# Patient Record
Sex: Male | Born: 1995 | Race: White | Hispanic: No | Marital: Single | Smoking: Never smoker
Health system: Southern US, Community
[De-identification: ages and names within clinical notes are randomized; demographics above are authoritative.]

## PROBLEM LIST (undated history)

## (undated) HISTORY — PX: WISDOM TOOTH EXTRACTION: SHX21

---
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
## 2014-03-04 ENCOUNTER — Encounter: Payer: Self-pay | Admitting: Family Medicine

## 2014-03-04 ENCOUNTER — Ambulatory Visit (INDEPENDENT_AMBULATORY_CARE_PROVIDER_SITE_OTHER): Payer: Managed Care, Other (non HMO) | Admitting: Family Medicine

## 2014-03-04 VITALS — BP 100/68 | Temp 99.5°F | Ht 64.0 in | Wt 114.4 lb

## 2014-03-04 DIAGNOSIS — J019 Acute sinusitis, unspecified: Secondary | ICD-10-CM

## 2014-03-04 DIAGNOSIS — J309 Allergic rhinitis, unspecified: Secondary | ICD-10-CM

## 2014-03-04 MED ORDER — AZITHROMYCIN 250 MG PO TABS
ORAL_TABLET | ORAL | Status: DC
Start: 2014-03-04 — End: 2014-04-11

## 2014-03-04 NOTE — Progress Notes (Signed)
   Subjective:    Patient ID: Dale Lopez, male    DOB: 11/04/1995, 18 y.o.   MRN: [id number]  Cough This is a new problem. The current episode started in the past 7 days. The problem has been unchanged. The problem occurs constantly. The cough is non-productive. Associated symptoms include a fever, headaches and a sore throat. Nothing aggravates the symptoms. He has tried OTC cough suppressant for the symptoms. The treatment provided no relief.   Patient states he has no other concerns at this time.    Review of Systems  Constitutional: Positive for fever.  HENT: Positive for sore throat.   Respiratory: Positive for cough.   Neurological: Positive for headaches.       Objective:   Physical Exam  Nursing note and vitals reviewed. Constitutional: He appears well-developed.  HENT:  Head: Normocephalic.  Mouth/Throat: Oropharynx is clear and moist. No oropharyngeal exudate.  Neck: Normal range of motion.  Cardiovascular: Normal rate, regular rhythm and normal heart sounds.   No murmur heard. Pulmonary/Chest: Effort normal and breath sounds normal. He has no wheezes.  Lymphadenopathy:    He has no cervical adenopathy.  Neurological: He exhibits normal muscle tone.  Skin: Skin is warm and dry.          Assessment & Plan:  Viral syndrome possible sinusitis antibiotics prescribed but encouraged not to get filled for couple days if improving no need for antibiotics. Warning signs discussed.

## 2014-04-11 ENCOUNTER — Ambulatory Visit (HOSPITAL_COMMUNITY)
Admission: RE | Admit: 2014-04-11 | Discharge: 2014-04-11 | Disposition: A | Discharge status: Home / Self Care | Payer: Managed Care, Other (non HMO) | Source: Ambulatory Visit | Attending: Family Medicine | Admitting: Family Medicine

## 2014-04-11 ENCOUNTER — Encounter: Payer: Self-pay | Admitting: Family Medicine

## 2014-04-11 ENCOUNTER — Ambulatory Visit (INDEPENDENT_AMBULATORY_CARE_PROVIDER_SITE_OTHER): Payer: Managed Care, Other (non HMO) | Admitting: Family Medicine

## 2014-04-11 VITALS — BP 100/72 | Temp 98.6°F | Ht 64.0 in | Wt 114.4 lb

## 2014-04-11 DIAGNOSIS — R05 Cough: Secondary | ICD-10-CM

## 2014-04-11 DIAGNOSIS — R079 Chest pain, unspecified: Secondary | ICD-10-CM | POA: Insufficient documentation

## 2014-04-11 DIAGNOSIS — R0789 Other chest pain: Secondary | ICD-10-CM

## 2014-04-11 DIAGNOSIS — R002 Palpitations: Secondary | ICD-10-CM

## 2014-04-11 DIAGNOSIS — R059 Cough, unspecified: Secondary | ICD-10-CM

## 2014-04-11 MED ORDER — BECLOMETHASONE DIPROPIONATE 40 MCG/ACT IN AERS: 2.0000 | INHALATION_SPRAY | Freq: Two times a day (BID) | RESPIRATORY_TRACT | Status: DC

## 2014-04-11 MED ORDER — BECLOMETHASONE DIPROPIONATE 40 MCG/ACT IN AERS: 1.0000 | INHALATION_SPRAY | Freq: Two times a day (BID) | RESPIRATORY_TRACT | Status: DC

## 2014-04-11 MED ORDER — PREDNISONE 20 MG PO TABS: ORAL_TABLET | ORAL | Status: DC

## 2014-04-11 NOTE — Progress Notes (Signed)
   Subjective:    Patient ID: Dale Lopez, male    DOB: Sep 26, 1996, 18 y.o.   MRN: [id number]  Cough This is a new problem. The current episode started in the past 7 days. The problem has been gradually worsening. The cough is non-productive. Associated symptoms include chest pain. Nothing aggravates the symptoms. Treatments tried: ibuprofen. The treatment provided no relief. His past medical history is significant for asthma.   Patient states that he has no other concerns at this time.  Sore in the side Sneezing Cough for over 5 weeks No fever History of allergic rhinitis. Has not been using any allergy medicine recently. Review of Systems  Respiratory: Positive for cough.   Cardiovascular: Positive for chest pain.   no fevers. Denies wheezing but does state he coughs a lot he states when he takes a deep breath he coughs.     Objective:   Physical Exam Patient cough several times while in the office. Eardrums normal throat is normal neck is supple lungs are clear cough is noted       Assessment & Plan:  Allergic rhinitis/probable allergic reactive airway as well recommend steroid inhaler prednisone taper and allergy tablets if not improving over the course of the next few days followup should be doing much better over the next few weeks chest x-ray ordered. Once doing well for at least 4-6 weeks he should try to stop the steroid inhaler to see if this stays away. He will let us know if he has ongoing trouble. May need referral to allergist if ongoing troubles.  Patient did have a few brief palpitations earlier today his EKG looks normal he is to try to avoid excessive caffeine he will notify us if any ongoing troubles

## 2014-04-16 NOTE — Progress Notes (Signed)
Patient notified and verbalized understanding. 

## 2014-04-21 ENCOUNTER — Encounter: Payer: Self-pay | Admitting: Family Medicine

## 2014-04-23 ENCOUNTER — Encounter: Payer: Self-pay | Admitting: Nurse Practitioner

## 2014-04-23 ENCOUNTER — Ambulatory Visit (INDEPENDENT_AMBULATORY_CARE_PROVIDER_SITE_OTHER): Payer: Managed Care, Other (non HMO) | Admitting: Nurse Practitioner

## 2014-04-23 VITALS — BP 102/64 | Temp 98.6°F | Ht 64.0 in | Wt 116.0 lb

## 2014-04-23 DIAGNOSIS — L738 Other specified follicular disorders: Secondary | ICD-10-CM

## 2014-04-23 DIAGNOSIS — L739 Follicular disorder, unspecified: Secondary | ICD-10-CM

## 2014-04-23 MED ORDER — DOXYCYCLINE HYCLATE 100 MG PO TABS: 100.0000 mg | ORAL_TABLET | Freq: Two times a day (BID) | ORAL | Status: DC

## 2014-04-27 ENCOUNTER — Encounter: Payer: Self-pay | Admitting: Nurse Practitioner

## 2014-04-27 NOTE — Progress Notes (Signed)
Subjective:  Presents complaints of a rash that began a few days ago. Nonpruritic. Minimally tender. Has faded some. No fever. No headache. Mild cough. No change in her hygiene products. No known contacts. Patient just completed a course of prednisone started 6/19. Has a history of acne but states this is suddenly worse.  Objective:   BP 102/64  Temp(Src) 98.6 F (37 C) (Oral)  Ht 5\' 4"  (1.626 m)  Wt 116 lb (52.617 kg)  BMI 19.90 kg/m2 NAD. Alert, oriented. TMs normal limit. Pharynx clear. Neck supple with minimal adenopathy. Lungs clear. Heart regular rhythm. Multiple moderately erythematous papules and pustules noted over the arms and trunk area with a few lesions on the face.  Assessment: Folliculitis  most likely steroid induced related to recent prednisone taper  Plan:  Meds ordered this encounter  Medications  . doxycycline (VIBRA-TABS) 100 MG tablet    Sig: Take 1 tablet (100 mg total) by mouth 2 (two) times daily.    Dispense:  20 tablet    Refill:  0    Order Specific Question:  Supervising Provider    Answer:  Riccardo DubinLUKING, WILLIAM S [[id number]]   Explained it is unlikely related to use of Qvar. Continue Qvar as directed. Call back next week if no improvement, sooner if worse.

## 2014-04-30 ENCOUNTER — Encounter: Payer: Self-pay | Admitting: Family Medicine

## 2014-04-30 ENCOUNTER — Ambulatory Visit (INDEPENDENT_AMBULATORY_CARE_PROVIDER_SITE_OTHER): Payer: Managed Care, Other (non HMO) | Admitting: Family Medicine

## 2014-04-30 VITALS — BP 112/76 | Temp 98.5°F | Ht 64.0 in | Wt 115.5 lb

## 2014-04-30 DIAGNOSIS — R748 Abnormal levels of other serum enzymes: Secondary | ICD-10-CM

## 2014-04-30 DIAGNOSIS — R109 Unspecified abdominal pain: Secondary | ICD-10-CM

## 2014-04-30 LAB — POCT URINALYSIS DIPSTICK
Spec Grav, UA: 1.01
pH, UA: 6

## 2014-04-30 LAB — CBC WITH DIFFERENTIAL/PLATELET
BASOS ABS: 0.1 10*3/uL (ref 0.0–0.1)
Basophils Relative: 1 % (ref 0–1)
EOS ABS: 0.2 10*3/uL (ref 0.0–0.7)
Eosinophils Relative: 3 % (ref 0–5)
HCT: 43.8 % (ref 39.0–52.0)
Hemoglobin: 15.7 g/dL (ref 13.0–17.0)
LYMPHS ABS: 1.9 10*3/uL (ref 0.7–4.0)
Lymphocytes Relative: 32 % (ref 12–46)
MCH: 28.7 pg (ref 26.0–34.0)
MCHC: 35.8 g/dL (ref 30.0–36.0)
MCV: 80.1 fL (ref 78.0–100.0)
Monocytes Absolute: 0.7 10*3/uL (ref 0.1–1.0)
Monocytes Relative: 11 % (ref 3–12)
Neutro Abs: 3.2 10*3/uL (ref 1.7–7.7)
Neutrophils Relative %: 53 % (ref 43–77)
Platelets: 240 10*3/uL (ref 150–400)
RBC: 5.47 MIL/uL (ref 4.22–5.81)
RDW: 14.1 % (ref 11.5–15.5)
WBC: 6 10*3/uL (ref 4.0–10.5)

## 2014-04-30 LAB — BASIC METABOLIC PANEL
BUN: 10 mg/dL (ref 6–23)
CHLORIDE: 105 meq/L (ref 96–112)
CO2: 27 meq/L (ref 19–32)
CREATININE: 0.99 mg/dL (ref 0.50–1.35)
Calcium: 10 mg/dL (ref 8.4–10.5)
Glucose, Bld: 71 mg/dL (ref 70–99)
POTASSIUM: 4.7 meq/L (ref 3.5–5.3)
Sodium: 140 mEq/L (ref 135–145)

## 2014-04-30 LAB — HEPATIC FUNCTION PANEL
ALBUMIN: 4.7 g/dL (ref 3.5–5.2)
ALK PHOS: 69 U/L (ref 39–117)
ALT: 59 U/L — ABNORMAL HIGH (ref 0–53)
AST: 30 U/L (ref 0–37)
BILIRUBIN TOTAL: 0.6 mg/dL (ref 0.2–1.1)
Bilirubin, Direct: 0.2 mg/dL (ref 0.0–0.3)
Indirect Bilirubin: 0.4 mg/dL (ref 0.2–1.1)
Total Protein: 7.3 g/dL (ref 6.0–8.3)

## 2014-04-30 LAB — LIPASE: Lipase: 25 U/L (ref 0–75)

## 2014-04-30 MED ORDER — PANTOPRAZOLE SODIUM 40 MG PO TBEC: 40.0000 mg | DELAYED_RELEASE_TABLET | Freq: Every day | ORAL | Status: DC

## 2014-04-30 NOTE — Progress Notes (Signed)
   Subjective:    Patient ID: Dale Lopez, male    DOB: [birth year], 18 y.o.   MRN: [id number]  Abdominal Pain This is a recurrent problem. The current episode started 1 to 4 weeks ago. The onset quality is gradual. The problem occurs intermittently. The problem has been gradually worsening. The pain is located in the RLQ, RUQ and LLQ. The pain is at a severity of 5/10. The pain is moderate. The quality of the pain is burning and cramping. The abdominal pain radiates to the back. Nothing aggravates the pain. The pain is relieved by nothing. Treatments tried: stool softeners. The treatment provided no relief.   Patient states he has no other concerns at this time.    Review of Systems  Gastrointestinal: Positive for abdominal pain.   denies vomiting diarrhea denies bloody stools. Denies right upper quadrant pain.     Objective:   Physical Exam Lungs clear heart regular neck neck no masses eardrums normal abdomen minimal tenderness       Assessment & Plan:  Abdominal pain PPI prescribed avoid NSAIDs labs ordered await results warning signs discussed

## 2014-05-09 NOTE — Addendum Note (Signed)
Addended byOneal Deputy: Roshawna Colclasure D on: 05/09/2014 03:34 PM   Modules accepted: Orders

## 2014-05-12 NOTE — Progress Notes (Signed)
Patient notified and verbalized understanding(US scheduled for 7/23 at 12:00 per pt request)

## 2014-05-15 ENCOUNTER — Ambulatory Visit (HOSPITAL_COMMUNITY)
Admission: RE | Admit: 2014-05-15 | Discharge: 2014-05-15 | Disposition: A | Discharge status: Home / Self Care | Payer: Managed Care, Other (non HMO) | Source: Ambulatory Visit | Attending: Family Medicine | Admitting: Family Medicine

## 2014-05-15 ENCOUNTER — Ambulatory Visit (INDEPENDENT_AMBULATORY_CARE_PROVIDER_SITE_OTHER): Payer: Managed Care, Other (non HMO) | Admitting: Family Medicine

## 2014-05-15 ENCOUNTER — Encounter: Payer: Self-pay | Admitting: Family Medicine

## 2014-05-15 VITALS — BP 118/82 | Ht 64.0 in | Wt 115.4 lb

## 2014-05-15 DIAGNOSIS — R748 Abnormal levels of other serum enzymes: Secondary | ICD-10-CM | POA: Insufficient documentation

## 2014-05-15 DIAGNOSIS — R109 Unspecified abdominal pain: Secondary | ICD-10-CM

## 2014-05-15 DIAGNOSIS — K2990 Gastroduodenitis, unspecified, without bleeding: Principal | ICD-10-CM

## 2014-05-15 DIAGNOSIS — K2970 Gastritis, unspecified, without bleeding: Secondary | ICD-10-CM

## 2014-05-15 NOTE — Progress Notes (Signed)
   Subjective:    Patient ID: Dale Lopez, male    DOB: August 27, 1996, 18 y.o.   MRN: [id number]  HPI  Patient arrives for a follow up on ad pain. Patient states he is feeling much better. He states he is trying eat healthy and strong stay physically active. He denies rectal bleeding Review of Systems  Constitutional: Negative for activity change, appetite change and fatigue.  HENT: Negative for congestion.   Respiratory: Negative for cough.   Cardiovascular: Negative for chest pain.  Gastrointestinal: Negative for abdominal pain.  Endocrine: Negative for polydipsia and polyphagia.  Neurological: Negative for weakness.  Psychiatric/Behavioral: Negative for confusion.       Objective:   Physical Exam  Vitals reviewed. Constitutional: He appears well-nourished. No distress.  Cardiovascular: Normal rate, regular rhythm and normal heart sounds.   No murmur heard. Pulmonary/Chest: Effort normal and breath sounds normal. No respiratory distress.  Musculoskeletal: He exhibits no edema.  Lymphadenopathy:    He has no cervical adenopathy.  Neurological: He is alert.  Psychiatric: His behavior is normal.          Assessment & Plan:  Abdominal pain resolved gastritis use medication for 3 months after that no need for further medication hopefully. He does not drink he is going to stay away from anti-inflammatories

## 2014-05-15 NOTE — Patient Instructions (Signed)
Use medicine for 3 months Follow up if problems

## 2014-05-29 ENCOUNTER — Ambulatory Visit: Payer: Managed Care, Other (non HMO) | Admitting: Family Medicine

## 2014-05-30 ENCOUNTER — Ambulatory Visit (INDEPENDENT_AMBULATORY_CARE_PROVIDER_SITE_OTHER): Payer: Managed Care, Other (non HMO) | Admitting: Family Medicine

## 2014-05-30 ENCOUNTER — Encounter: Payer: Self-pay | Admitting: Family Medicine

## 2014-05-30 VITALS — BP 110/70 | Temp 98.7°F | Ht 64.0 in | Wt 115.5 lb

## 2014-05-30 DIAGNOSIS — R7401 Elevation of levels of liver transaminase levels: Secondary | ICD-10-CM

## 2014-05-30 DIAGNOSIS — R1013 Epigastric pain: Secondary | ICD-10-CM

## 2014-05-30 DIAGNOSIS — G8929 Other chronic pain: Secondary | ICD-10-CM

## 2014-05-30 DIAGNOSIS — R7402 Elevation of levels of lactic acid dehydrogenase (LDH): Secondary | ICD-10-CM

## 2014-05-30 DIAGNOSIS — K640 First degree hemorrhoids: Secondary | ICD-10-CM

## 2014-05-30 DIAGNOSIS — R740 Nonspecific elevation of levels of transaminase and lactic acid dehydrogenase [LDH]: Secondary | ICD-10-CM

## 2014-05-30 DIAGNOSIS — K649 Unspecified hemorrhoids: Secondary | ICD-10-CM

## 2014-05-30 NOTE — Patient Instructions (Signed)

## 2014-05-30 NOTE — Progress Notes (Signed)
   Subjective:    Patient ID: Dale BallsNicholas Lopez, male    DOB: June 20, 1996, 18 y.o.   MRN: [id number]  HPI Patient is here today because he has a possible hemorrhoid. It has been present for about 4 days now. Patient states that it is painful to touch. Patient tried hemorrhoid cream and sitz bath. Patient states he is not sure if it helped or not.   Patient states he has no other concerns at this time.    Time was spent reviewing his lab work his abdominal pain from previous result Review of Systems Patient does have intermittent constipation I went over dietary measures that should be of help this.    Objective:   Physical Exam  Patient with mild hemorrhoids. Abdomen is soft. I went over his lab work including his elevated liver he agrees to repeat the liver enzymes again in a few weeks time if it is still elevated then we will do ultrasound if it is normal no ultrasound this no masses felt in his abdomen liver is not enlarged      Assessment & Plan:  Slight elevation liver enzymes recheck this again in a few weeks if not normal then will do ultrasound  Hemorrhoid-this is a small hemorrhoid measures to help him go down were discussed

## 2016-10-10 ENCOUNTER — Ambulatory Visit (INDEPENDENT_AMBULATORY_CARE_PROVIDER_SITE_OTHER): Payer: Managed Care, Other (non HMO) | Admitting: Family Medicine

## 2016-10-10 VITALS — BP 110/72 | Temp 98.3°F | Ht 64.0 in | Wt 116.0 lb

## 2016-10-10 DIAGNOSIS — R350 Frequency of micturition: Secondary | ICD-10-CM

## 2016-10-10 DIAGNOSIS — M545 Low back pain, unspecified: Secondary | ICD-10-CM

## 2016-10-10 LAB — POCT URINALYSIS DIPSTICK
Bilirubin, UA: NEGATIVE
GLUCOSE UA: NEGATIVE
Ketones, UA: NEGATIVE
NITRITE UA: POSITIVE
PROTEIN UA: NEGATIVE
SPEC GRAV UA: 1.01
UROBILINOGEN UA: 0.2
pH, UA: 6

## 2016-10-10 NOTE — Progress Notes (Signed)
   Subjective:    Patient ID: Dale Lopez, male    DOB: 12-Sep-1996, 20 y.o.   MRN: [id number]  Back Pain  This is a new problem. The current episode started 1 to 4 weeks ago. The problem occurs constantly. The problem is unchanged. The pain is present in the lumbar spine. The quality of the pain is described as aching. The pain does not radiate. The pain is at a severity of 4/10. The pain is mild. The pain is the same all the time. Stiffness is present all day. Pertinent negatives include no abdominal pain, bladder incontinence, bowel incontinence, chest pain or numbness. He has tried nothing for the symptoms.    Patient in office today c/o lumbago x 1 month.    Patient states he noticed "flakes" in urine about 1 week ago. He denies dysuria, nausea/vomiting, and fever.  Review of Systems  Cardiovascular: Negative for chest pain.  Gastrointestinal: Negative for abdominal pain and bowel incontinence.  Genitourinary: Negative for bladder incontinence.  Musculoskeletal: Positive for back pain.  Neurological: Negative for numbness.       Objective:   Physical Exam Lungs are clear hearts regular low back subjective discomfort tight hamstrings Abdomen soft UA negative      Assessment & Plan:  Urine culture sent Patient will return with a new urine specimen he relates that there is a lot of specs in a previous urine I do not see any in this urine he will give us one more specimen Back lumbar go recommend stretching exercises follow-up of ongoing

## 2016-10-11 ENCOUNTER — Telehealth: Payer: Self-pay | Admitting: *Deleted

## 2016-10-11 ENCOUNTER — Other Ambulatory Visit: Payer: Self-pay | Admitting: *Deleted

## 2016-10-11 DIAGNOSIS — R350 Frequency of micturition: Secondary | ICD-10-CM

## 2016-10-11 NOTE — Telephone Encounter (Signed)
Pt dropped off urine today. Was seen yesterday. Urine dipped and ready to look at under microscope.

## 2016-10-11 NOTE — Telephone Encounter (Signed)
Error. Urine not dipped. He had urine dipstick yesterday and urine culture. Do you want to repeat. Note states he dropped off because he saw specks in his urine.

## 2016-10-12 NOTE — Telephone Encounter (Signed)
Advised patient Dr Dale Lopez looked at his urine and does not find any evidence of a problem. If the patient is having ongoing urinary issues over the course of the next couple  week's the  next step would be for him to be referred to urology. Currently Dr Dale Lopez does not feel referral is necessary. Patient verbalized understanding.

## 2016-10-12 NOTE — Telephone Encounter (Signed)
I looked at his urine I do not find any evidence of a problem. If the patient is having ongoing urinary issues over the course of the next couple  week's the  next step would be for him to be referred to urology. currently I  do not feel referral is necessary

## 2016-10-14 LAB — URINE CULTURE

## 2017-05-25 ENCOUNTER — Encounter: Payer: Self-pay | Admitting: Nurse Practitioner

## 2017-05-25 ENCOUNTER — Ambulatory Visit (INDEPENDENT_AMBULATORY_CARE_PROVIDER_SITE_OTHER): Payer: Managed Care, Other (non HMO) | Admitting: Nurse Practitioner

## 2017-05-25 VITALS — BP 120/88 | Temp 98.4°F | Ht 64.0 in | Wt 115.0 lb

## 2017-05-25 DIAGNOSIS — B9689 Other specified bacterial agents as the cause of diseases classified elsewhere: Secondary | ICD-10-CM

## 2017-05-25 DIAGNOSIS — J069 Acute upper respiratory infection, unspecified: Secondary | ICD-10-CM | POA: Diagnosis not present

## 2017-05-25 MED ORDER — AZITHROMYCIN 250 MG PO TABS: ORAL_TABLET | ORAL | 0 refills | Status: DC

## 2017-05-25 NOTE — Patient Instructions (Addendum)
Antihistamine Nasacort AQ nasal spray  Hydrogen peroxide mixed 1:1 with warm water

## 2017-05-25 NOTE — Progress Notes (Signed)
Subjective:  Presents for complaints of sinus symptoms that began 3 days ago. No significant fever. No sore throat or sinus headache. Has had some sneezing and coughing for weeks, nonproductive. No change. No wheezing. Occasional itching and pressure mainly in the left ear with occasional ringing in the ear. No acid reflux heartburn or abdominal pain. Nonsmoker.  Objective:   BP 120/88   Temp 98.4 F (36.9 C) (Oral)   Ht 5\' 4"  (1.626 m)   Wt 115 lb (52.2 kg)   BMI 19.74 kg/m  NAD. Alert, oriented. TMs clear effusion, no erythema but few partially obscured with cerumen bilaterally. Pharynx posterior mildly erythematous with PND noted. Neck supple with mild soft anterior adenopathy. Lungs clear. No wheezing or tachypnea. Heart regular rate rhythm. Abdomen soft nontender.  Assessment:  Bacterial upper respiratory infection    Plan:   Meds ordered this encounter  Medications  . azithromycin (ZITHROMAX Z-PAK) 250 MG tablet    Sig: Take 2 tablets (500 mg) on  Day 1,  followed by 1 tablet (250 mg) once daily on Days 2 through 5.    Dispense:  6 each    Refill:  0    Order Specific Question:   Supervising Provider    Answer:   Merlyn AlbertLUKING, WILLIAM S [[id number]]   OTC meds as directed for sinus congestion. Call back if symptoms worsen or persist.

## 2017-05-30 ENCOUNTER — Ambulatory Visit (INDEPENDENT_AMBULATORY_CARE_PROVIDER_SITE_OTHER): Payer: Managed Care, Other (non HMO) | Admitting: Family Medicine

## 2017-05-30 ENCOUNTER — Encounter: Payer: Self-pay | Admitting: Family Medicine

## 2017-05-30 VITALS — BP 104/84 | Temp 98.4°F | Ht 64.0 in | Wt 114.0 lb

## 2017-05-30 DIAGNOSIS — H6123 Impacted cerumen, bilateral: Secondary | ICD-10-CM | POA: Diagnosis not present

## 2017-05-30 DIAGNOSIS — H6122 Impacted cerumen, left ear: Secondary | ICD-10-CM

## 2017-05-30 NOTE — Progress Notes (Signed)
   Subjective:    Patient ID: Dale Lopez, male    DOB: 11-Mar-1996, 21 y.o.   MRN: [id number]  Otalgia    Patient here today for ear pain and states the left ear is blocked and has decreased hearing from that ear. He states he has some ringing in the ears also, but does not know from which ear it originates.Started two weeks ago. Used peroxide, did not help. No other concerns.  Two weeks ago had ear discmfort   [phone] Then developed ringing  Tried peroxid  Pt has blocked sensation with ringing  Gets headaches  Now on antihist for sinus allergies   Non smoker   Review of Systems  HENT: Positive for ear pain.        Objective:   Physical Exam Alert vitals stable, NAD. Blood pressure good on repeat. HEENT Bilateral wax impaction otherwise normal. Lungs clear. Heart regular rate and rhythm.        Assessment & Plan:  Impression bilateral wax impaction. Long discussion held. Patient is Dale Lopez tried peroxide and Debrox in water irrigation. Experiencing tinnitus with bilateral wax obstruction. Also nonspecific mild headache. Options discussed. Patient chooses ENT referral

## 2017-06-05 ENCOUNTER — Encounter: Payer: Self-pay | Admitting: Family Medicine

## 2017-06-09 ENCOUNTER — Ambulatory Visit (INDEPENDENT_AMBULATORY_CARE_PROVIDER_SITE_OTHER): Payer: Managed Care, Other (non HMO) | Admitting: Nurse Practitioner

## 2017-06-09 VITALS — Temp 98.3°F | Ht 64.0 in | Wt 114.4 lb

## 2017-06-09 DIAGNOSIS — J329 Chronic sinusitis, unspecified: Secondary | ICD-10-CM

## 2017-06-09 MED ORDER — AZITHROMYCIN 250 MG PO TABS: ORAL_TABLET | ORAL | 0 refills | Status: DC

## 2017-06-12 ENCOUNTER — Encounter: Payer: Self-pay | Admitting: Nurse Practitioner

## 2017-06-12 NOTE — Progress Notes (Signed)
Subjective:  Presents for c/o mild frontal area headache, ear pressure and popping that began around 8/13. No fever, sore throat or runny nose. Occasional mild cough. No wheezing.   Objective:   Temp 98.3 F (36.8 C) (Oral)   Ht 5\' 4"  (1.626 m)   Wt 114 lb 6.4 oz (51.9 kg)   BMI 19.64 kg/m  NAD. Alert, oriented. TMs clear effusion. Pharynx injected with green PND noted. Neck supple with mild anterior adenopathy. Lungs clear. Heart RRR.   Assessment:  Rhinosinusitis    Plan:   Meds ordered this encounter  Medications  . azithromycin (ZITHROMAX Z-PAK) 250 MG tablet    Sig: Take 2 tablets (500 mg) on  Day 1,  followed by 1 tablet (250 mg) once daily on Days 2 through 5.    Dispense:  6 each    Refill:  0    Order Specific Question:   Supervising Provider    Answer:   Merlyn Albert [[id number]]   OTC meds as directed. Call back if worsens or persists.

## 2017-06-28 ENCOUNTER — Encounter: Payer: Self-pay | Admitting: Family Medicine

## 2017-09-29 ENCOUNTER — Ambulatory Visit (INDEPENDENT_AMBULATORY_CARE_PROVIDER_SITE_OTHER): Payer: Managed Care, Other (non HMO) | Admitting: Nurse Practitioner

## 2017-09-29 ENCOUNTER — Encounter: Payer: Self-pay | Admitting: Nurse Practitioner

## 2017-09-29 VITALS — Temp 98.9°F | Ht 64.0 in | Wt 119.8 lb

## 2017-09-29 DIAGNOSIS — B9689 Other specified bacterial agents as the cause of diseases classified elsewhere: Secondary | ICD-10-CM | POA: Diagnosis not present

## 2017-09-29 DIAGNOSIS — J069 Acute upper respiratory infection, unspecified: Secondary | ICD-10-CM | POA: Diagnosis not present

## 2017-09-29 MED ORDER — PREDNISONE 20 MG PO TABS: ORAL_TABLET | ORAL | 0 refills | Status: DC

## 2017-09-29 MED ORDER — AMOXICILLIN-POT CLAVULANATE 875-125 MG PO TABS: 1.0000 | ORAL_TABLET | Freq: Two times a day (BID) | ORAL | 0 refills | Status: DC

## 2017-10-01 ENCOUNTER — Encounter: Payer: Self-pay | Admitting: Nurse Practitioner

## 2017-10-01 NOTE — Progress Notes (Signed)
Subjective: Presents for complaints of cough and congestion for the past week.  Worse in the mornings.  Scratchy throat and left ear pressure.  Slight chest tightness with deep breath only in the mornings.  Has used an albuterol inhaler in the morning for the past 3 days.  No fever or headache.  Non-smoker.  Objective:   Temp 98.9 F (37.2 C) (Oral)   Ht 5\' 4"  (1.626 m)   Wt 119 lb 12.8 oz (54.3 kg)   BMI 20.56 kg/m  NAD.  Alert, oriented.  TMs clear effusion bilateral, no erythema.  Pharynx injected with green PND noted.  Neck supple with mild soft anterior adenopathy.  Lungs clear.  Heart regular rate and rhythm.  Assessment:  Bacterial upper respiratory infection    Plan:   Meds ordered this encounter  Medications  . amoxicillin-clavulanate (AUGMENTIN) 875-125 MG tablet    Sig: Take 1 tablet by mouth 2 (two) times daily.    Dispense:  20 tablet    Refill:  0    Order Specific Question:   Supervising Provider    Answer:   Merlyn AlbertLUKING, WILLIAM S [[id number]]  . predniSONE (DELTASONE) 20 MG tablet    Sig: 3 po qd x 3 d then 2 po qd x 3 d then 1 po qd x 2 d    Dispense:  17 tablet    Refill:  0    Order Specific Question:   Supervising Provider    Answer:   Merlyn AlbertLUKING, WILLIAM S [[id number]]   OTC meds as directed for congestion and cough.  Also given prescription for prednisone to have over the weekend with inclement weather in case tightness/reactive airways worsen.  Warning signs reviewed.  Recheck if symptoms worsen or persist.

## 2017-10-26 ENCOUNTER — Ambulatory Visit (INDEPENDENT_AMBULATORY_CARE_PROVIDER_SITE_OTHER): Payer: 59 | Admitting: Family Medicine

## 2017-10-26 ENCOUNTER — Encounter: Payer: Self-pay | Admitting: Family Medicine

## 2017-10-26 VITALS — BP 118/72 | Temp 98.8°F | Ht 64.0 in | Wt 119.0 lb

## 2017-10-26 DIAGNOSIS — J329 Chronic sinusitis, unspecified: Secondary | ICD-10-CM

## 2017-10-26 MED ORDER — BENZONATATE 100 MG PO CAPS: 100.0000 mg | ORAL_CAPSULE | Freq: Two times a day (BID) | ORAL | 0 refills | Status: DC | PRN

## 2017-10-26 MED ORDER — CLARITHROMYCIN 500 MG PO TABS: 500.0000 mg | ORAL_TABLET | Freq: Two times a day (BID) | ORAL | 0 refills | Status: DC

## 2017-10-26 NOTE — Progress Notes (Signed)
   Subjective:    Patient ID: Dale Lopez, male    DOB: 10/05/1996, 22 y.o.   MRN: [id number]  Sinusitis  This is a recurrent problem. Episode onset: one month  Associated symptoms include coughing, ear pain and headaches. Treatments tried: augmentin, prednisone. The treatment provided no relief.    Sick was improved but not healed  Does not smoke   no hx of inhaler                     Feeling tight a tties   Took all the ug  Along with the pred  Review of Systems  HENT: Positive for ear pain.   Respiratory: Positive for cough.   Neurological: Positive for headaches.       Objective:   Physical Exam   Alert, mild malaise. Hydration good Vitals stable. frontal/ maxillary tenderness evident positive nasal congestion. pharynx normal neck supple  lungs clear/no crackles or wheezes. heart regular in rhythm      Assessment & Plan:  Impression persistent rhinosinusitis likely post viral, discussed with patient. plan antibiotics prescribed. Questions answered. Symptomatic care discussed. warning signs discussed. WSL

## 2017-10-29 ENCOUNTER — Other Ambulatory Visit: Payer: Self-pay

## 2017-10-29 ENCOUNTER — Encounter (HOSPITAL_COMMUNITY): Payer: Self-pay | Admitting: Emergency Medicine

## 2017-10-29 ENCOUNTER — Emergency Department (HOSPITAL_COMMUNITY)
Admission: EM | Admit: 2017-10-29 | Discharge: 2017-10-29 | Disposition: A | Discharge status: Home / Self Care | Payer: 59 | Attending: Emergency Medicine | Admitting: Emergency Medicine

## 2017-10-29 DIAGNOSIS — H9313 Tinnitus, bilateral: Secondary | ICD-10-CM

## 2017-10-29 DIAGNOSIS — H9203 Otalgia, bilateral: Secondary | ICD-10-CM | POA: Diagnosis present

## 2017-10-29 MED ORDER — BUTALBITAL-APAP-CAFFEINE 50-325-40 MG PO TABS: 1.0000 | ORAL_TABLET | Freq: Four times a day (QID) | ORAL | 0 refills | Status: DC | PRN

## 2017-10-29 MED ORDER — FLUTICASONE PROPIONATE 50 MCG/ACT NA SUSP: 2.0000 | Freq: Every day | NASAL | 0 refills | Status: DC

## 2017-10-29 MED ORDER — ALPRAZOLAM 0.5 MG PO TABS: 0.5000 mg | ORAL_TABLET | Freq: Three times a day (TID) | ORAL | 0 refills | Status: AC | PRN

## 2017-10-29 NOTE — Discharge Instructions (Signed)
It is unclear what is causing the ringing in your ears and the rest of your symptoms. You need to be reevaluated by ENT again for further  evaluation and recommendations.   At this time, we will treat some of her symptoms and hopes to provide you some relief. There are not many medications that have been proven to fix ringing in the ear, but we will try alprazolam that can been shown to provide some relief. Use Flonase for associated nasal congestion, nasal congestion can worsen sinus pressure and worsen ringing in the ears. Continue taking her antihistamine. Take fioricet for headache. Take alprazolam for ringing in your ears and at that time.

## 2017-10-29 NOTE — ED Triage Notes (Signed)
PT c/o recurrent hx of ear infection and ringing in his ears worsening over the past 5 months. PT stated he has followed up with ENT recently and then went back to PCP (Dr. Gerda DissLuking) on 10/26/17 and was started on Clarithromycin and benzonatate that day.

## 2017-10-29 NOTE — ED Provider Notes (Signed)
[facility] EMERGENCY DEPARTMENT Provider Note   CSN: [id number] Arrival date & time: 10/29/17  1807     History   Chief Complaint Chief Complaint  Patient presents with  . Otalgia    HPI Dale Lopez is a 22 y.o. male presents to the ED for evaluation of recurrent, intermittent tinnitus to bilateral ears. Patient states 5 months ago he noticed a whistling sound in his left ear, this has progressively gotten worse. Now hears a "base like humming" to the right ear. States the ringing in ears both change in type and pitch. Associated symptoms include tingling of his ears, redness, warmth to his ears, nasal congestion, headache. Headache usually brought on my ringing in ear. Has seen his PCP for this twice before, who referred him to ENT. Was evaluated by ENT recently and told he had bilateral cerumen impaction. Return to PCP and was prescribed antihistamines, clarithromycin and steroids which have not helped.  Prior to symptom onset 5 months ago, patient denies head trauma, medication changes, illnesses or infections. He denies associated fevers, chills, vision changes, sore throat, sinus pain, dizziness, slurred speech, unilateral weakness or numbness. No known autoimmune diseases. No exposure to loud noises. No family history of early hearing loss. No h/o migraines.   HPI  History reviewed. No pertinent past medical history.  There are no active problems to display for this patient.   Past Surgical History:  Procedure Laterality Date  . WISDOM TOOTH EXTRACTION         Home Medications    Prior to Admission medications   Medication Sig Start Date End Date Taking? Authorizing Provider  ALPRAZolam Prudy Feeler) 0.5 MG tablet Take 1 tablet (0.5 mg total) by mouth 3 (three) times daily as needed for up to 3 days (ringing in ears). 10/29/17 11/01/17  Liberty Handy, PA-C  benzonatate (TESSALON) 100 MG capsule Take 1 capsule (100 mg total) by mouth 2 (two) times daily as needed for cough.  10/26/17   Merlyn Albert, MD  butalbital-acetaminophen-caffeine (FIORICET, ESGIC) [phone] MG tablet Take 1-2 tablets by mouth every 6 (six) hours as needed for headache. 10/29/17 10/29/18  Liberty Handy, PA-C  clarithromycin (BIAXIN) 500 MG tablet Take 1 tablet (500 mg total) by mouth 2 (two) times daily. 10/26/17   Merlyn Albert, MD  fluticasone (FLONASE) 50 MCG/ACT nasal spray Place 2 sprays into both nostrils daily. FOR NASAL CONGESTION 10/29/17   Liberty Handy, PA-C    Family History History reviewed. No pertinent family history.  Social History Social History   Tobacco Use  . Smoking status: Never Smoker  . Smokeless tobacco: Never Used  Substance Use Topics  . Alcohol use: Yes    Comment: rarely  . Drug use: No     Allergies   Other   Review of Systems Review of Systems  HENT: Positive for congestion and tinnitus.   Neurological: Positive for headaches.  All other systems reviewed and are negative.    Physical Exam Updated Vital Signs BP 122/82 (BP Location: Right Arm)   Pulse (!) 101   Temp 98.5 F (36.9 C) (Oral)   Resp 17   Ht 5\' 4"  (1.626 m)   Wt 54.4 kg (120 lb)   SpO2 96%   BMI 20.60 kg/m   Physical Exam  Constitutional: He is oriented to person, place, and time. He appears well-developed and well-nourished. No distress.  NAD.  HENT:  Head: Normocephalic and atraumatic.  Right Ear: External ear normal.  Left  Ear: External ear normal.  Nose: Nose normal.  Head: No lesions on scalp. Skull and facial bones symmetric, non-tender without bony abnormalities. Frontal and maxillary sinuses are non-tender to percussion.  Eyes: Lids symmetrical without lag or palpable mass. Sclera white without prominent vessels. Conjunctiva pink. PERRL and EOMs intact bilaterally.   Ears: External ear withut lesions, swelling, deformities or tenderness in mastoid area. R and L external ear auditory canals clear without edema or erythema. No pain reported with  external ear manipulation.  TMs pearly gray with visible cone of light and bony landmarks bilaterally, no bulging or cloudiness.  Nose: Nasal mucosa pink. No nasal mucosa edema.  No nasal discharge. No sinus tenderness. Septum midline.   Throat: Lips are pink and symmetrical. Dentition normal.  Gingiva, labial and buccal mucosa pink without lesions, tenderness or fluctuance. Oropharynx and tonsils moist without erythema, edema or exudates. Uvula midline. No trismus.   Eyes: Conjunctivae and EOM are normal. No scleral icterus.  Neck: Normal range of motion. Neck supple.  Cardiovascular: Normal rate, regular rhythm, normal heart sounds and intact distal pulses.  No murmur heard. Pulmonary/Chest: Effort normal and breath sounds normal. He has no wheezes.  Musculoskeletal: Normal range of motion. He exhibits no deformity.  Neurological: He is alert and oriented to person, place, and time.  Speech is fluent Strength 5/5 with hand grip and ankle F/E.   Sensation to light touch intact in hands and feet. Normal gait. No pronator drift.  Normal finger-to-nose and heel-to-shin test.  CN I and VIII not tested. CN II-XII intact bilaterally.   Skin: Skin is warm and dry. Capillary refill takes less than 2 seconds.  Psychiatric: He has a normal mood and affect. His behavior is normal. Judgment and thought content normal.  Nursing note and vitals reviewed.    ED Treatments / Results  Labs (all labs ordered are listed, but only abnormal results are displayed) Labs Reviewed - No data to display  EKG  EKG Interpretation None       Radiology No results found.  Procedures Procedures (including critical care time)  Medications Ordered in ED Medications - No data to display   Initial Impression / Assessment and Plan / ED Course  I have reviewed the triage vital signs and the nursing notes.  Pertinent labs & imaging results that were available during my care of the patient were reviewed by  me and considered in my medical decision making (see chart for details).    22 year old male presents with bilateral tinnitus for several weeks. Began having headaches the last 2-3 weeks. Has been seen by a PCP and ENT numerous times before for this. Initially told had bilateral cerumen impaction, this was removed by ENT however symptoms persisted. PCP saw him 3 days ago for this and prescribed him steroids and erythromycin which have also not helped.  Exam today is reassuring. External ear, ear canal, tympanic membrane and bony landmarks look completely normal. No significant nasal congestion noted. No sinus tenderness. I did a full neurological exam but no deficits noted. No recent head trauma, new medications, underlying comorbidities.  Unclear etiology of tinnitus. This has been chronic. I don't think emergent lab work or imaging is indicated. Up-to-date was used to supplement treatment approach. We'll discharge withfioricet for headache, Flonase for associated symptoms. Alprazolam for tinnitus. I think there is some anxiety/stress contributing. Patient did note this to me as well. Discussed return precautions. Patient and father at bedside are agreeable and will follow up with  a second ENT for another evaluation.  Final Clinical Impressions(s) / ED Diagnoses   Final diagnoses:  Tinnitus of both ears    ED Discharge Orders        Ordered    butalbital-acetaminophen-caffeine (FIORICET, ESGIC) 50-325-40 MG tablet  Every 6 hours PRN     10/29/17 1926    ALPRAZolam (XANAX) 0.5 MG tablet  3 times daily PRN     10/29/17 1926    fluticasone (FLONASE) 50 MCG/ACT nasal spray  Daily     10/29/17 1926       Liberty HandyGibbons, Cyriah Childrey J, PA-C 10/29/17 1946    Vanetta MuldersZackowski, Scott, MD 10/30/17 [phone]

## 2017-12-04 ENCOUNTER — Ambulatory Visit (INDEPENDENT_AMBULATORY_CARE_PROVIDER_SITE_OTHER): Payer: 59 | Admitting: Nurse Practitioner

## 2017-12-04 ENCOUNTER — Encounter: Payer: Self-pay | Admitting: Nurse Practitioner

## 2017-12-04 VITALS — BP 130/82 | Temp 98.4°F | Ht 64.0 in | Wt 119.6 lb

## 2017-12-04 DIAGNOSIS — H9313 Tinnitus, bilateral: Secondary | ICD-10-CM | POA: Diagnosis not present

## 2017-12-05 ENCOUNTER — Encounter: Payer: Self-pay | Admitting: Nurse Practitioner

## 2017-12-05 NOTE — Progress Notes (Signed)
Subjective:  Presents for c/o intermittent ringing in the ears that began about 6 months ago. Occurs everyday. Usually worse later in the day/evening. No visual changes. Occasional mild headache on various parts of the head. Has seen Dr. Ezzard [facility] for his symptoms which included an audiology exam which was normal. Has had rare sudden hearing loss only lasting a few seconds. No dizziness or syncopal episodes. No fever, sore throat or cough. Taking antihistamine and steroid nasal spray. Tried an OTC supplement for tinnitus which he stopped due to nausea. Gets better when he plays music or background noise. See ED note 10/29/17. Was given Xanax for anxiety.   Objective:   BP 130/82   Temp 98.4 F (36.9 C) (Oral)   Ht 5\' 4"  (1.626 m)   Wt 119 lb 9.6 oz (54.3 kg)   BMI 20.53 kg/m  NAD. Alert, oriented. Mildly anxious affect. TMs clear effusion, no erythema. Pupils equal and reactive to light. EOMs intact without nystagmus. Point to point localization normal. Hand strength 5+ bilat. Reflexes normal. Gait normal. Romberg neg. Lungs clear. Heart RRR. No murmur or gallop noted. Carotids no bruits or thrills.   Assessment:  Tinnitus of both ears    Plan:  Based on review of tinnitus, will send to different ENT specialist for evaluation. Since no neurologic abnormalities by history or physical, recommend holding off on scan at this point; ENT may elect to do specialized scan. Question the role of anxiety in his symptoms. Warning signs were reviewed with patient. Call back in the meantime if worse.    25 minutes was spent with the patient.  This statement verifies that 25 minutes was indeed spent with the patient. Greater than half the time was spent in discussion, counseling and answering questions  regarding the issues that the patient came in for today as reflected in the diagnosis (s) please refer to documentation for further details.

## 2017-12-06 ENCOUNTER — Telehealth: Payer: Self-pay | Admitting: Nurse Practitioner

## 2017-12-06 NOTE — Telephone Encounter (Signed)
As we discussed at his visit, I reviewed up to date for latest research.   I have put in a referral to a different ENT. His neurologic exam was normal here and in the ED in January. Want to avoid exposing him to radiation of a CT scan if not necessary and a specialist may want a very specific type of scan depending on their findings. Please ask patient to take a half or whole Xanax to see if this will help anxiety in the meantime. Contact our office in the meantime if he gets worse or new symptoms develop.

## 2017-12-06 NOTE — Telephone Encounter (Signed)
Patient saw Eber JonesCarolyn on 12-04-17. Dad called saying son is experiencing significant anxiety over problems with his ears.  Dad wanting to know if we can refer him to a neurologist or order a cat scan or something?  Dad Marylen Ponto(Barry Massing) left his phone number [phone] but I am not sure DPR has been signed.  He said son could be reached at home# or cell# to discuss.

## 2017-12-07 NOTE — Telephone Encounter (Signed)
Left message to return call 

## 2017-12-07 NOTE — Telephone Encounter (Signed)
Discussed with pt. Pt verbalized understanding. He wants to think about the xanax and will call back if he wants to get rx.

## 2017-12-11 ENCOUNTER — Encounter: Payer: Self-pay | Admitting: Family Medicine

## 2017-12-12 ENCOUNTER — Other Ambulatory Visit: Payer: Self-pay | Admitting: Nurse Practitioner

## 2017-12-12 ENCOUNTER — Telehealth: Payer: Self-pay | Admitting: Nurse Practitioner

## 2017-12-12 MED ORDER — FLUTICASONE PROPIONATE 50 MCG/ACT NA SUSP
2.0000 | Freq: Every day | NASAL | 5 refills | Status: DC
Start: 2017-12-12 — End: 2021-01-18

## 2017-12-12 MED ORDER — ALPRAZOLAM 0.5 MG PO TABS: 0.5000 mg | ORAL_TABLET | Freq: Two times a day (BID) | ORAL | 0 refills | Status: DC | PRN

## 2017-12-12 NOTE — Telephone Encounter (Signed)
Refills done.

## 2017-12-12 NOTE — Telephone Encounter (Signed)
Pt requesting refill on fluticasone (FLONASE) 50 MCG/ACT nasal spray and ALPRAZolam (XANAX) 0.5 MG tablet   [facility]/[address]  Please call pt when done - explained to pt that Dale Lopez would be back tomorrow & it may be then before he hears from us

## 2017-12-12 NOTE — Telephone Encounter (Signed)
meds were prescribed in the ED in January. Pt states he talked with carolyn about them when he came in on 2/11.

## 2017-12-21 ENCOUNTER — Ambulatory Visit (INDEPENDENT_AMBULATORY_CARE_PROVIDER_SITE_OTHER): Payer: 59 | Admitting: Otolaryngology

## 2017-12-21 DIAGNOSIS — H9313 Tinnitus, bilateral: Secondary | ICD-10-CM

## 2021-01-18 ENCOUNTER — Encounter: Payer: Self-pay | Admitting: Family Medicine

## 2021-01-18 ENCOUNTER — Other Ambulatory Visit: Payer: Self-pay

## 2021-01-18 ENCOUNTER — Ambulatory Visit (INDEPENDENT_AMBULATORY_CARE_PROVIDER_SITE_OTHER): Payer: 59 | Admitting: Family Medicine

## 2021-01-18 VITALS — BP 111/75 | HR 74 | Temp 98.4°F | Ht 64.0 in | Wt 113.2 lb

## 2021-01-18 DIAGNOSIS — H60501 Unspecified acute noninfective otitis externa, right ear: Secondary | ICD-10-CM | POA: Diagnosis not present

## 2021-01-18 MED ORDER — CIPROFLOXACIN-DEXAMETHASONE 0.3-0.1 % OT SUSP: 4.0000 [drp] | Freq: Two times a day (BID) | OTIC | 0 refills | Status: DC

## 2021-01-18 NOTE — Progress Notes (Signed)
Pt having right ear pain. States he has wax in ear and when he wiped the outer ear there was blood. Pt having some drainage from right ear. Pt did use Debrox to help clean ear.  Pt also has knot on back. Lower back area right at pant line. Been there at least 2 years. Area is numb.     Patient ID: Dale Lopez, male    DOB: [birth year], 25 y.o.   MRN: [id number]   Chief Complaint  Patient presents with  . Ear Pain   Subjective:  Cc: right ear pain and area on low back  This is a new problem.  Presents today with a complaint of right ear pain, wax impaction, bloody drainage.  Symptoms have been present for about a week, has been using Debrox, and has seen blood on the Q-tip x2.  Reports hearing changes and tinnitus, tinnitus is not a new problem.  Does use earplugs at work.  Reports that he was told that he was putting the earplugs into deeply.  Denies fever, chills, chest pain, shortness of breath.    Medical History Dale Lopez has no past medical history on file.   Outpatient Encounter Medications as of 01/18/2021  Medication Sig  . ciprofloxacin-dexamethasone (CIPRODEX) OTIC suspension Place 4 drops into the right ear 2 (two) times daily. For 7 days  . [DISCONTINUED] ALPRAZolam (XANAX) 0.5 MG tablet Take 1 tablet (0.5 mg total) by mouth 2 (two) times daily as needed for anxiety. (Patient not taking: Reported on 01/18/2021)  . [DISCONTINUED] fluticasone (FLONASE) 50 MCG/ACT nasal spray Place 2 sprays into both nostrils daily. FOR NASAL CONGESTION   No facility-administered encounter medications on file as of 01/18/2021.     Review of Systems  Constitutional: Negative for chills and fever.  HENT: Positive for ear discharge, ear pain and hearing loss. Negative for congestion, rhinorrhea, sinus pressure, sinus pain and sore throat.   Respiratory: Negative for cough and shortness of breath.   Cardiovascular: Negative for chest pain.  Gastrointestinal: Negative for abdominal pain.   Neurological: Negative for headaches.     Vitals BP 111/75   Pulse 74   Temp 98.4 F (36.9 C)   Ht 5\' 4"  (1.626 m)   Wt 113 lb 3.2 oz (51.3 kg)   SpO2 97%   BMI 19.43 kg/m   Objective:   Physical Exam Vitals reviewed.  HENT:     Right Ear: Decreased hearing noted. Drainage present.     Ears:     Comments: Right ear canal swollen, unable to visualize TM, area of irritation seen (possibly site of bleeding). Could not see TM to verify perforation or not.  Cardiovascular:     Rate and Rhythm: Normal rate and regular rhythm.     Heart sounds: Normal heart sounds.  Pulmonary:     Effort: Pulmonary effort is normal.     Breath sounds: Normal breath sounds.  Skin:    General: Skin is warm and dry.  Neurological:     General: No focal deficit present.     Mental Status: He is alert.  Psychiatric:        Behavior: Behavior normal.      Assessment and Plan   1. Acute otitis externa of right ear, unspecified type - ciprofloxacin-dexamethasone (CIPRODEX) OTIC suspension; Place 4 drops into the right ear 2 (two) times daily. For 7 days  Dispense: 7.5 mL; Refill: 0 - Ambulatory referral to ENT   Significant swelling right ear canal, unable to visualize  TM.  Area of irritation noted external ear, possibly area of bleeding.  Will treat with Ciprodex for 7 days, ambulatory referral to ENT placed.  Agrees with plan of care discussed today. Understands warning signs to seek further care: chest pain, shortness of breath, any significant change in health.  Understands to follow-up if symptoms do not improve, or worsen.  Referral to ENT placed.  Work note provided.    Dale Olive, NP 01/18/2021

## 2021-01-18 NOTE — Patient Instructions (Signed)

## 2021-02-15 ENCOUNTER — Encounter: Payer: Self-pay | Admitting: Family Medicine

## 2021-02-15 ENCOUNTER — Ambulatory Visit (INDEPENDENT_AMBULATORY_CARE_PROVIDER_SITE_OTHER): Payer: 59 | Admitting: Family Medicine

## 2021-02-15 ENCOUNTER — Other Ambulatory Visit: Payer: Self-pay

## 2021-02-15 VITALS — BP 126/78 | HR 85 | Temp 99.8°F | Ht 64.0 in | Wt 109.0 lb

## 2021-02-15 DIAGNOSIS — J40 Bronchitis, not specified as acute or chronic: Secondary | ICD-10-CM

## 2021-02-15 DIAGNOSIS — J029 Acute pharyngitis, unspecified: Secondary | ICD-10-CM

## 2021-02-15 LAB — POCT RAPID STREP A (OFFICE): Rapid Strep A Screen: NEGATIVE

## 2021-02-15 MED ORDER — AZITHROMYCIN 250 MG PO TABS: ORAL_TABLET | ORAL | 0 refills | Status: AC

## 2021-02-15 NOTE — Patient Instructions (Signed)

## 2021-02-15 NOTE — Progress Notes (Signed)
Patient ID: Dale Lopez, male    DOB: [birth year], 25 y.o.   MRN: [id number]   Chief Complaint  Patient presents with  . Headache    Fever, chills, sore throat, cough- tested negative for covid on wednesday   Subjective:    HPI  4 days having uri symptoms. Having pain in joints first day. Got better.  Fever chills and 99-100.40F  meds- delysm, flonase. Albuterol, ibuprofen.  Having headaches, coughing, sore throat.   Sick contacts- Father had fever and sinuses for 4 wks. Mother had covid 1-2 wks ago, asymptomatic.  covid- test- 2x neg testing. Tested with home test on 02/12/21 and 02/11/21.   Medical History Naftali has no past medical history on file.   Outpatient Encounter Medications as of 02/15/2021  Medication Sig  . ALBUTEROL IN Inhale into the lungs.  [name] azithromycin (ZITHROMAX) 250 MG tablet Take 2 tablets on day 1, then 1 tablet daily on days 2 through 5  . fluticasone (FLONASE) 50 MCG/ACT nasal spray Place into both nostrils daily.  [name] ibuprofen (ADVIL) 200 MG tablet Take 200 mg by mouth every 6 (six) hours as needed.  . [DISCONTINUED] ciprofloxacin-dexamethasone (CIPRODEX) OTIC suspension Place 4 drops into the right ear 2 (two) times daily. For 7 days   No facility-administered encounter medications on file as of 02/15/2021.     Review of Systems  Constitutional: Positive for chills and fever.  HENT: Positive for congestion, rhinorrhea and sore throat. Negative for ear pain, sinus pressure, sinus pain and sneezing.   Eyes: Negative for pain, discharge and itching.  Respiratory: Positive for cough.   Gastrointestinal: Negative for diarrhea, nausea and vomiting.  Skin: Negative for rash.  Neurological: Negative for headaches.     Vitals BP 126/78   Pulse 85   Temp 99.8 F (37.7 C)   Ht 5\' 4"  (1.626 m)   Wt 109 lb (49.4 kg)   SpO2 100%   BMI 18.71 kg/m   Objective:   Physical Exam Vitals and nursing note reviewed.  Constitutional:       General: He is not in acute distress.    Appearance: Normal appearance. He is ill-appearing.  HENT:     Head: Normocephalic and atraumatic.     Comments: Nose- mild clear discharge from nares.     Right Ear: Tympanic membrane, ear canal and external ear normal.     Left Ear: Tympanic membrane, ear canal and external ear normal.     Nose: Nose normal. No congestion or rhinorrhea.     Mouth/Throat:     Mouth: Mucous membranes are moist.     Pharynx: No oropharyngeal exudate or posterior oropharyngeal erythema.     Comments: +mild erythema in oropharynx. No exudates. Eyes:     Extraocular Movements: Extraocular movements intact.     Conjunctiva/sclera: Conjunctivae normal.     Pupils: Pupils are equal, round, and reactive to light.  Cardiovascular:     Rate and Rhythm: Normal rate and regular rhythm.     Pulses: Normal pulses.     Heart sounds: Normal heart sounds. No murmur heard.   Pulmonary:     Effort: Pulmonary effort is normal. No respiratory distress.     Breath sounds: Normal breath sounds. No wheezing, rhonchi or rales.  Musculoskeletal:     Cervical back: Normal range of motion.  Lymphadenopathy:     Cervical: No cervical adenopathy.  Skin:    General: Skin is warm and dry.     Findings: No  rash.  Neurological:     Mental Status: He is alert and oriented to person, place, and time.  Psychiatric:        Mood and Affect: Mood normal.        Behavior: Behavior normal.      Assessment and Plan   1. Bronchitis - ALBUTEROL IN; Inhale into the lungs. - fluticasone (FLONASE) 50 MCG/ACT nasal spray; Place into both nostrils daily. - azithromycin (ZITHROMAX) 250 MG tablet; Take 2 tablets on day 1, then 1 tablet daily on days 2 through 5  Dispense: 6 tablet; Refill: 0  2. Sore throat - POCT rapid strep A - Novel Coronavirus, NAA ([facility])   Rapid strep-negative.  Possible cold/flu virus vs. Bronchitis.  Pt looking ill appearing, gave script for azithromycin. Cont  with delsym, flonase, albuterol and inc fluids. Cont ibuprofen/tylenol. Call or rto if not improving in next 2-3 days.  Pt in agreement.  covid test pending.   Return if symptoms worsen or fail to improve.   02/15/2021

## 2021-02-16 LAB — SARS-COV-2, NAA 2 DAY TAT

## 2021-02-16 LAB — SPECIMEN STATUS REPORT

## 2021-02-16 LAB — NOVEL CORONAVIRUS, NAA: SARS-CoV-2, NAA: NOT DETECTED

## 2021-02-18 ENCOUNTER — Encounter: Payer: Self-pay | Admitting: Family Medicine

## 2021-02-18 ENCOUNTER — Telehealth: Payer: Self-pay | Admitting: Family Medicine

## 2021-02-18 NOTE — Telephone Encounter (Signed)
Pt called into office and states he is not any better. Pt was seen on Monday for headache, cold chills, sore throat and cough. Pt now having hard time catching his breath and bad cough. Pt still having sore throat but not as bad. Pt was offered a 10:40 with Dr.Taylor or 11:40 with Dr.Scott. pt states he is not able to come at either time. Pt advised to go to ER or Urgent care. Pt states that sounds expesnsive and he guesses he will just ride it out. Again emphasized ER or urgent care.

## 2021-02-18 NOTE — Telephone Encounter (Signed)
He may have a work excuse for Wednesday, Thursday, and Friday

## 2021-02-19 ENCOUNTER — Ambulatory Visit (HOSPITAL_COMMUNITY)
Admission: RE | Admit: 2021-02-19 | Discharge: 2021-02-19 | Disposition: A | Discharge status: Home / Self Care | Payer: 59 | Source: Ambulatory Visit | Attending: Family Medicine | Admitting: Family Medicine

## 2021-02-19 ENCOUNTER — Other Ambulatory Visit: Payer: Self-pay

## 2021-02-19 ENCOUNTER — Encounter: Payer: Self-pay | Admitting: Family Medicine

## 2021-02-19 ENCOUNTER — Ambulatory Visit (INDEPENDENT_AMBULATORY_CARE_PROVIDER_SITE_OTHER): Payer: 59 | Admitting: Family Medicine

## 2021-02-19 VITALS — BP 110/86 | HR 83 | Temp 100.4°F | Wt 113.2 lb

## 2021-02-19 DIAGNOSIS — J40 Bronchitis, not specified as acute or chronic: Secondary | ICD-10-CM | POA: Diagnosis present

## 2021-02-19 DIAGNOSIS — R0689 Other abnormalities of breathing: Secondary | ICD-10-CM | POA: Diagnosis not present

## 2021-02-19 DIAGNOSIS — R0609 Other forms of dyspnea: Secondary | ICD-10-CM

## 2021-02-19 DIAGNOSIS — R0600 Dyspnea, unspecified: Secondary | ICD-10-CM | POA: Diagnosis not present

## 2021-02-19 DIAGNOSIS — R059 Cough, unspecified: Secondary | ICD-10-CM

## 2021-02-19 MED ORDER — BENZONATATE 100 MG PO CAPS: 100.0000 mg | ORAL_CAPSULE | Freq: Two times a day (BID) | ORAL | 0 refills | Status: DC | PRN

## 2021-02-19 MED ORDER — PREDNISONE 10 MG PO TABS: ORAL_TABLET | ORAL | 0 refills | Status: DC

## 2021-02-19 NOTE — Progress Notes (Signed)
Pt having cough and shortness of breath. Cough since Monday and short of breath since yesterday. Pt states he did get a mild headache yesterday but took Ibuprofen and did not think anything of it. Has one dose left of Z pack.     Patient ID: Dale Lopez, male    DOB: 10-09-1996, 25 y.o.   MRN: [id number]   Chief Complaint  Patient presents with  . Cough   Subjective:  Cc: cough and shortness of breath  This is not a new problem.  Presents today for an acute visit with complaint of cough and shortness of breath.  Was last seen at this office by Dr. Ladona Ridgel on April 25, diagnosed with bronchitis, given Z-Pak.  Has already had an albuterol inhaler, been using Delsym, Flonase and ibuprofen for fever.  Denies any blood in the sputum, denies any recent tick bites.  Reports that he is been having some labored breathing with any activity and with lying down.  Endorses fever and chills, congestion and runny nose.  Sore throat has resolved.  Had a headache yesterday, took some ibuprofen, this resolved.  Appears to not feel well.    Medical History Sha has no past medical history on file.   Outpatient Encounter Medications as of 02/19/2021  Medication Sig  . ALBUTEROL IN Inhale into the lungs.  [name] azithromycin (ZITHROMAX) 250 MG tablet Take 2 tablets on day 1, then 1 tablet daily on days 2 through 5  . benzonatate (TESSALON) 100 MG capsule Take 1 capsule (100 mg total) by mouth 2 (two) times daily as needed for cough.  . fluticasone (FLONASE) 50 MCG/ACT nasal spray Place into both nostrils daily.  [name] ibuprofen (ADVIL) 200 MG tablet Take 200 mg by mouth every 6 (six) hours as needed.  . predniSONE (DELTASONE) 10 MG tablet Take 3 tablets by mouth for 3 days, then 2 tablets by mouth for 3 days, then one tablet by mouth for 3 days.   No facility-administered encounter medications on file as of 02/19/2021.     Review of Systems  Constitutional: Positive for chills and fever.  HENT: Positive for  congestion and rhinorrhea.        Sore throat resolved.   Respiratory: Positive for cough and shortness of breath. Negative for wheezing.   Cardiovascular: Negative for chest pain.  Musculoskeletal: Negative for joint swelling and myalgias.       Felt bad with joint aches last Thursday.   Skin: Negative for rash.  Neurological: Positive for headaches.       Headache yesterday, took ibuprofen.     Vitals BP 110/86   Pulse 83   Temp (!) 100.4 F (38 C)   Wt 113 lb 3.2 oz (51.3 kg)   SpO2 99%   BMI 19.43 kg/m   Objective:   Physical Exam Vitals reviewed.  Cardiovascular:     Rate and Rhythm: Normal rate and regular rhythm.     Heart sounds: Normal heart sounds.  Pulmonary:     Effort: Pulmonary effort is normal.     Breath sounds: Examination of the right-lower field reveals decreased breath sounds. Decreased breath sounds present. No wheezing.  Abdominal:     General: Bowel sounds are normal.  Skin:    General: Skin is warm and dry.  Neurological:     General: No focal deficit present.     Mental Status: He is alert.  Psychiatric:        Behavior: Behavior normal.  Assessment and Plan   1. Bronchitis - predniSONE (DELTASONE) 10 MG tablet; Take 3 tablets by mouth for 3 days, then 2 tablets by mouth for 3 days, then one tablet by mouth for 3 days.  Dispense: 18 tablet; Refill: 0 - DG Chest 2 View  2. Cough in adult - predniSONE (DELTASONE) 10 MG tablet; Take 3 tablets by mouth for 3 days, then 2 tablets by mouth for 3 days, then one tablet by mouth for 3 days.  Dispense: 18 tablet; Refill: 0 - DG Chest 2 View - benzonatate (TESSALON) 100 MG capsule; Take 1 capsule (100 mg total) by mouth 2 (two) times daily as needed for cough.  Dispense: 20 capsule; Refill: 0  3. Decreased breath sounds at right lung base - DG Chest 2 View  4. Dyspnea on exertion - DG Chest 2 View   Will send to [facility] for stat chest x-ray to rule out pneumonia: lung sounds  diminished on right base with dyspnea on exertion--  will prescribe prednisone taper for persistent cough/bronchitis. Finishing azithromycin  today. Will add benzonatate.   Update:  Chest x-ray negative for pneumonia. Notified by phone. Continue with treatment plan previously discussed.   Agrees with plan of care discussed today. Understands warning signs to seek further care: chest pain, shortness of breath, any significant change in health.  Understands to follow-up if symptoms do not improve or worsen.    Dale Olive, NP 02/19/2021

## 2021-02-22 ENCOUNTER — Encounter: Payer: Self-pay | Admitting: Family Medicine

## 2021-02-24 ENCOUNTER — Ambulatory Visit (INDEPENDENT_AMBULATORY_CARE_PROVIDER_SITE_OTHER): Payer: 59 | Admitting: Family Medicine

## 2021-02-24 ENCOUNTER — Other Ambulatory Visit: Payer: Self-pay

## 2021-02-24 DIAGNOSIS — R059 Cough, unspecified: Secondary | ICD-10-CM | POA: Diagnosis not present

## 2021-02-24 DIAGNOSIS — J208 Acute bronchitis due to other specified organisms: Secondary | ICD-10-CM | POA: Diagnosis not present

## 2021-02-24 MED ORDER — ALBUTEROL SULFATE HFA 108 (90 BASE) MCG/ACT IN AERS: 2.0000 | INHALATION_SPRAY | RESPIRATORY_TRACT | 2 refills | Status: DC | PRN

## 2021-02-24 MED ORDER — BUDESONIDE-FORMOTEROL FUMARATE 80-4.5 MCG/ACT IN AERO: 2.0000 | INHALATION_SPRAY | Freq: Two times a day (BID) | RESPIRATORY_TRACT | 1 refills | Status: DC

## 2021-02-24 NOTE — Progress Notes (Signed)
   Subjective:    Patient ID: Dale Lopez, male    DOB: [birth year], 25 y.o.   MRN: [id number]  Cough This is a new problem. The current episode started 1 to 4 weeks ago. Associated symptoms include nasal congestion and shortness of breath. Associated symptoms comments: Chest pain.   Seen 02/15/21 and 02/19/21 but not doing any better  Multiple negative PCR Covid tests. CXR 02/19/21  Review of Systems  Respiratory: Positive for cough and shortness of breath.        Objective:   Physical Exam  Frequent cough noted lungs are clear no crackles heart regular pulse normal extremities no edema no lymphadenopathy noted  Warnings were discussed    Assessment & Plan:  Previous notes reviewed Previous x-ray reviewed Would recommend Symbicort twice daily for the next month Albuterol as needed If not dramatically better within the next 7 days follow-up Give [place] feedback over the next 7 days how things are going No need for pulmonary consult at this point Parents have similar illness hinting toward a viral source

## 2021-03-01 ENCOUNTER — Other Ambulatory Visit: Payer: Self-pay

## 2021-03-01 ENCOUNTER — Ambulatory Visit (INDEPENDENT_AMBULATORY_CARE_PROVIDER_SITE_OTHER): Payer: 59 | Admitting: Family Medicine

## 2021-03-01 ENCOUNTER — Encounter: Payer: Self-pay | Admitting: Family Medicine

## 2021-03-01 VITALS — BP 136/94 | HR 77 | Temp 100.2°F | Wt 113.4 lb

## 2021-03-01 DIAGNOSIS — B9689 Other specified bacterial agents as the cause of diseases classified elsewhere: Secondary | ICD-10-CM | POA: Diagnosis not present

## 2021-03-01 DIAGNOSIS — R059 Cough, unspecified: Secondary | ICD-10-CM | POA: Diagnosis not present

## 2021-03-01 DIAGNOSIS — J019 Acute sinusitis, unspecified: Secondary | ICD-10-CM | POA: Diagnosis not present

## 2021-03-01 MED ORDER — CLARITHROMYCIN 250 MG PO TABS: 250.0000 mg | ORAL_TABLET | Freq: Two times a day (BID) | ORAL | 0 refills | Status: DC

## 2021-03-01 NOTE — Progress Notes (Signed)
Patient ID: Dale Lopez, male    DOB: 1996-05-13, 25 y.o.   MRN: [id number]   Chief Complaint  Patient presents with  . Cough   Subjective:  CC: follow- up for persistent cough  This is not a new problem.  This is the fourth visit to this office for cough, shortness of breath.    April 25,: diagnosed with bronchitis, given albuterol, Flonase, and Z-Pak.  He was negative for COVID at that time.  April 29: Seen for cough and shortness of breath, chest x-ray performed negative for pneumonia.  Prednisone taper started at that time.  May 4: Seen diagnosed with viral bronchitis, prescribed Tessalon Perles, albuterol and Symbicort inhaler.  Today: Continues to have cough, feels bad, headache, has been out of work for the last 10 days.    Pt here to have cough rechecked. Pt seen 02/24/21 and was prescribed inhaler. Cough is the same. Pt is having shortness of breathing and trouble breathing. Inhaler is helping. Pt is coughing up phelgm at times. Pt is also having headaches and "weighteyness?" pt states at time he will give out of breath and have to lean against a wall. Sinus heaviness.  Medical History Andrew has no past medical history on file.   Outpatient Encounter Medications as of 03/01/2021  Medication Sig  . albuterol (VENTOLIN HFA) 108 (90 Base) MCG/ACT inhaler Inhale 2 puffs into the lungs every 4 (four) hours as needed for wheezing.  . benzonatate (TESSALON) 100 MG capsule Take 1 capsule (100 mg total) by mouth 2 (two) times daily as needed for cough.  . budesonide-formoterol (SYMBICORT) 80-4.5 MCG/ACT inhaler Inhale 2 puffs into the lungs 2 (two) times daily.  . clarithromycin (BIAXIN) 250 MG tablet Take 1 tablet (250 mg total) by mouth 2 (two) times daily.  . fluticasone (FLONASE) 50 MCG/ACT nasal spray Place into both nostrils daily.  [name] ibuprofen (ADVIL) 200 MG tablet Take 200 mg by mouth every 6 (six) hours as needed.  . [DISCONTINUED] predniSONE  (DELTASONE) 10 MG tablet Take 3 tablets by mouth for 3 days, then 2 tablets by mouth for 3 days, then one tablet by mouth for 3 days.   No facility-administered encounter medications on file as of 03/01/2021.     Review of Systems  Constitutional: Negative for chills and fever (unable to determine if he has fever, thermeter is broken. ).  Respiratory: Positive for cough (phlegm occassionally), shortness of breath and wheezing.   Gastrointestinal: Positive for abdominal pain and nausea. Negative for vomiting.     Vitals BP (!) 136/94   Pulse 77   Temp 100.2 F (37.9 C)   Wt 113 lb 6.4 oz (51.4 kg)   SpO2 96%   BMI 19.47 kg/m   Objective:   Physical Exam Vitals reviewed.  HENT:     Right Ear: Tympanic membrane normal.     Left Ear: Tympanic membrane normal.     Nose:     Right Sinus: Frontal sinus tenderness present. No maxillary sinus tenderness.     Left Sinus: Frontal sinus tenderness present. No maxillary sinus tenderness.     Mouth/Throat:     Pharynx: Oropharynx is clear. Uvula midline.  Cardiovascular:     Rate and Rhythm: Normal rate and regular rhythm.     Heart sounds: Normal heart sounds.  Pulmonary:     Effort: Pulmonary effort is normal.     Breath sounds: Normal breath sounds.  Abdominal:     General: Bowel sounds are  normal.     Tenderness: There is no abdominal tenderness. There is no guarding or rebound.  Skin:    General: Skin is warm and dry.  Neurological:     General: No focal deficit present.     Mental Status: He is alert.  Psychiatric:        Behavior: Behavior normal.      Assessment and Plan   1. Cough in adult - Ambulatory referral to Pulmonology  2. Acute bacterial rhinosinusitis - clarithromycin (BIAXIN) 250 MG tablet; Take 1 tablet (250 mg total) by mouth 2 (two) times daily.  Dispense: 20 tablet; Refill: 0   Persistent cough for almost 3 weeks.  Continues to feel bad despite therapies.  Frontal sinus tenderness upon physical  exam, will treat with clarithromycin twice per day for 10 days.  Urgent pulmonary referral placed for further evaluation/treatment.  May need pulmonary function test.  Consult with Dr. Lilyan Punt while patient in the office.  Agrees with my plan.  Agrees with plan of care discussed today. Understands warning signs to seek further care: chest pain, shortness of breath, any significant change in health.  Understands to follow-up with pulmonology, at this office sooner if needed.  If symptoms completely resolve with second antibiotic, okay to not keep pulmonary appointment.  Work note provided.  Will need to initiate FMLA paperwork process.    [name], NP 03/01/2021

## 2021-03-01 NOTE — Patient Instructions (Signed)

## 2021-03-03 ENCOUNTER — Encounter: Payer: Self-pay | Admitting: Family Medicine

## 2021-03-03 NOTE — Telephone Encounter (Signed)
Nurses May have a work note through the rest of the week through the weekend  Also please go ahead and have referral marked as urgent to try to have pulmonary see him as quickly as possible but it is quite possible this still could be several days out because of the scheduling of pulmonary

## 2021-03-04 ENCOUNTER — Telehealth: Payer: Self-pay | Admitting: Family Medicine

## 2021-03-04 ENCOUNTER — Encounter: Payer: Self-pay | Admitting: Family Medicine

## 2021-03-04 NOTE — Addendum Note (Signed)
Addended by: Marlowe Shores on: 03/04/2021 09:25 AM   Modules accepted: Orders

## 2021-03-04 NOTE — Telephone Encounter (Signed)
Discussed with pt. Pt verbalized understanding.  °

## 2021-03-04 NOTE — Telephone Encounter (Signed)
Left message to return call 

## 2021-03-04 NOTE — Telephone Encounter (Signed)
Please make sure patient is aware of this appointment Unfortunately this is something we cannot move up any sooner The patient can call there to be put onto a waiting list or cancellation list If he is not showing signs of improvement by next week let us know we can try other measures if possible

## 2021-03-04 NOTE — Telephone Encounter (Signed)
For Mr. Isola - He is scheduled on 03/29/2021 at 3:15 at [facility] in [place]- pulmonary per Toni Amend

## 2021-03-05 DIAGNOSIS — Z029 Encounter for administrative examinations, unspecified: Secondary | ICD-10-CM

## 2021-03-08 ENCOUNTER — Ambulatory Visit (INDEPENDENT_AMBULATORY_CARE_PROVIDER_SITE_OTHER): Payer: 59 | Admitting: Family Medicine

## 2021-03-08 ENCOUNTER — Encounter: Payer: Self-pay | Admitting: Family Medicine

## 2021-03-08 ENCOUNTER — Telehealth: Payer: Self-pay

## 2021-03-08 ENCOUNTER — Other Ambulatory Visit: Payer: Self-pay

## 2021-03-08 VITALS — HR 92 | Temp 98.0°F

## 2021-03-08 DIAGNOSIS — J40 Bronchitis, not specified as acute or chronic: Secondary | ICD-10-CM

## 2021-03-08 DIAGNOSIS — R059 Cough, unspecified: Secondary | ICD-10-CM | POA: Diagnosis not present

## 2021-03-08 MED ORDER — HYDROCODONE BIT-HOMATROP MBR 5-1.5 MG/5ML PO SOLN
5.0000 mL | Freq: Three times a day (TID) | ORAL | 0 refills | Status: DC | PRN
Start: 2021-03-08 — End: 2021-03-29

## 2021-03-08 NOTE — Patient Instructions (Signed)
Homatropine; Hydrocodone oral syrup What is this medicine? HYDROCODONE (hye droe KOE done) is used to help relieve cough. This medicine may be used for other purposes; ask your health care provider or pharmacist if you have questions. COMMON BRAND NAME(S): Hycodan, Hydromet, Hydropane, Mycodone What should I tell my health care provider before I take this medicine? They need to know if you have any of these conditions:  Addison's disease  brain tumor  gallbladder disease  glaucoma  head injury  heart disease  history of a drug or alcohol abuse problem  history of irregular heartbeat  if you often drink alcohol  kidney disease  liver disease  low blood pressure  lung or breathing disease, like asthma  mental illness  pancreatic disease  seizures  stomach or intestine problems  thyroid disease  trouble passing urine  an unusual or allergic reaction to hydrocodone, other medicines, foods, dyes, or preservatives  pregnant or trying to get pregnant  breast-feeding How should I use this medicine? Take this medicine by mouth. Follow the directions on the prescription label. You can take it with or without food. If it upsets your stomach, take it with food. Use a specially marked spoon or container to measure each dose. Ask your pharmacist if you do not have one. Household spoons are not accurate. Do not to overfill. Rinse the measuring device with water after each use. Take your medicine at regular intervals. Do not take it more often than directed. A special MedGuide will be given to you by the pharmacist with each prescription and refill. Be sure to read this information carefully each time. Talk to your pediatrician regarding the use of this medicine in children. This medicine is not approved for use in children. Overdosage: If you think you have taken too much of this medicine contact a poison control center or emergency room at once. NOTE: This medicine is  only for you. Do not share this medicine with others. What if I miss a dose? If you miss a dose, take it as soon as you can. If it is almost time for your next dose, take only that dose. Do not take double or extra doses. What may interact with this medicine? Do not take this medicine with any of the following medications:  alcohol  antihistamines for allergy, cough and cold  certain medicines for anxiety or sleep  certain medicines for depression like amitriptyline, fluoxetine, sertraline  certain medicines for seizures like carbamazepine, phenobarbital, phenytoin, primidone  general anesthetics like halothane, isoflurane, methoxyflurane, propofol  local anesthetics like lidocaine, pramoxine, tetracaine  MAOIs like Carbex, Eldepryl, Marplan, Nardil, and Parnate  other narcotic medicines (opiates) for pain or cough  phenothiazines like chlorpromazine, mesoridazine, prochlorperazine, thioridazine This medicine may also interact with the following medications:  antiviral medicines for HIV and AIDS  atropine  certain antibiotics like clarithromycin, erythromycin  certain medicines for bladder problems like oxybutynin, tolterodine  certain medicines for fungal infections like ketoconazole and itraconazole  certain medicines for Parkinson's disease like benztropine, trihexyphenidyl  certain medicines for stomach problems like dicyclomine, hyoscyamine  certain medicines for travel sickness like scopolamine  ipratropium  rifampin This list may not describe all possible interactions. Give your health care provider a list of all the medicines, herbs, non-prescription drugs, or dietary supplements you use. Also tell them if you smoke, drink alcohol, or use illegal drugs. Some items may interact with your medicine. What should I watch for while using this medicine? Use exactly as directed  by your doctor or health care professional. Do not take more than the recommended dose. You  may develop tolerance to this medicine if you take it for a long time. Tolerance means that you will get less cough relief with time. Tell your doctor or health care professional if your symptoms do not improve or if they get worse. If you have been taking this medicine for a long time, do not suddenly stop taking it because you may develop a severe reaction. Your body becomes used to the medicine. This does NOT mean you are addicted. Addiction is a behavior related to getting and using a drug for a nonmedical reason. If your doctor wants you to stop the medicine, the dose will be slowly lowered over time to avoid any side effects. There are different types of narcotic medicines (opiates). If you take more than one type at the same time or if you are taking another medicine that also causes drowsiness, you may have more side effects. Give your health care provider a list of all medicines you use. Your doctor will tell you how much medicine to take. Do not take more medicine than directed. Call emergency for help if you have problems breathing or unusual sleepiness. You may get drowsy or dizzy. Do not drive, use machinery, or do anything that needs mental alertness until you know how this medicine affects you. Do not stand or sit up quickly, especially if you are an older patient. This reduces the risk of dizzy or fainting spells. Alcohol may interfere with the effect of this medicine. Avoid alcoholic drinks. This medicine will cause constipation. Try to have a bowel movement at least every 2 to 3 days. If you do not have a bowel movement for 3 days, call your doctor or health care professional. Your mouth may get dry. Chewing sugarless gum or sucking hard candy, and drinking plenty of water may help. Contact your doctor if the problem does not go away or is severe. What side effects may I notice from receiving this medicine? Side effects that you should report to your doctor or health care professional as  soon as possible:  allergic reactions like skin rash, itching or hives, swelling of the face, lips, or tongue  breathing problems  confusion  signs and symptoms of low blood pressure like dizziness; feeling faint or lightheaded, falls; unusually weak or tired  trouble passing urine or change in the amount of urine Side effects that usually do not require medical attention (report to your doctor or health care professional if they continue or are bothersome):  constipation  dry mouth  nausea, vomiting  tiredness This list may not describe all possible side effects. Call your doctor for medical advice about side effects. You may report side effects to FDA at 1-800-FDA-1088. Where should I keep my medicine? Keep out of the reach of children. This medicine can be abused. Keep your medicine in a safe place to protect it from theft. Do not share this medicine with anyone. Selling or giving away this medicine is dangerous and against the law. This medicine may cause accidental overdose and death if taken by other adults, children, or pets. Mix any unused medicine with a substance like cat littler or coffee grounds. Then throw the medicine away in a sealed container like a sealed bag or a coffee can with a lid. Do not use the medicine after the expiration date. Store at room temperature between 15 and 30 degrees C (59 and 86  degrees F). Protect from light. NOTE: This sheet is a summary. It may not cover all possible information. If you have questions about this medicine, talk to your doctor, pharmacist, or health care provider.  2021 Elsevier/Gold Standard (2017-05-04 16:00:40)

## 2021-03-08 NOTE — Progress Notes (Signed)
Patient ID: Dale Lopez, male    DOB: Apr 28, 1996, 25 y.o.   MRN: [id number]   Chief Complaint  Patient presents with  . Cough    SOB continues   Subjective:  CC: cough and shortness of breath  This is not a new problem.  Presents today for the fifth time for bronchitis in chronic cough.  First time being treated with April 25 where he received an antibiotic.  Over the course of his treatment he has been on 2 antibiotics, prednisone taper, albuterol inhaler, Symbicort inhaler, negative chest x-ray.  Reports today that he still coughing, and having shortness of breath with exertion.  Continues to take his clarithromycin has several more days to go.  Reports that he has occasions where he goes hours without coughing, but then it starts back.  He also reports that he is doing a new job, has been on this job for 6 to 8 weeks, working with chemicals in the cigarette factory, symptoms started greater than 4 weeks ago.  Wonders if this could be related.  Has been out of work for 2 weeks.  Endorses fever, cough, shortness of breath, and headaches.    Medical History Dale Lopez has no past medical history on file.   Outpatient Encounter Medications as of 03/08/2021  Medication Sig  . HYDROcodone bit-homatropine (HYCODAN) 5-1.5 MG/5ML syrup Take 5 mLs by mouth every 8 (eight) hours as needed for cough.  [name] albuterol (VENTOLIN HFA) 108 (90 Base) MCG/ACT inhaler Inhale 2 puffs into the lungs every 4 (four) hours as needed for wheezing.  . benzonatate (TESSALON) 100 MG capsule Take 1 capsule (100 mg total) by mouth 2 (two) times daily as needed for cough.  . budesonide-formoterol (SYMBICORT) 80-4.5 MCG/ACT inhaler Inhale 2 puffs into the lungs 2 (two) times daily.  . clarithromycin (BIAXIN) 250 MG tablet Take 1 tablet (250 mg total) by mouth 2 (two) times daily.  . fluticasone (FLONASE) 50 MCG/ACT nasal spray Place into both nostrils daily.  [name] ibuprofen (ADVIL) 200 MG tablet Take 200 mg by mouth every 6  (six) hours as needed.   No facility-administered encounter medications on file as of 03/08/2021.     Review of Systems  Constitutional: Positive for fever. Negative for chills.  Respiratory: Positive for cough and shortness of breath.   Gastrointestinal: Negative for abdominal pain.  Neurological: Positive for headaches.     Vitals Pulse 92   Temp 98 F (36.7 C) (Oral)   SpO2 99%   Objective:   Physical Exam Vitals reviewed.  Cardiovascular:     Rate and Rhythm: Normal rate and regular rhythm.     Heart sounds: Normal heart sounds.  Pulmonary:     Effort: Pulmonary effort is normal.     Breath sounds: Normal breath sounds. No wheezing or rhonchi.  Skin:    General: Skin is warm and dry.  Neurological:     General: No focal deficit present.     Mental Status: He is alert.  Psychiatric:        Behavior: Behavior normal.      Assessment and Plan   1. Bronchitis - HYDROcodone bit-homatropine (HYCODAN) 5-1.5 MG/5ML syrup; Take 5 mLs by mouth every 8 (eight) hours as needed for cough.  Dispense: 120 mL; Refill: 0  2. Cough in adult patient - HYDROcodone bit-homatropine (HYCODAN) 5-1.5 MG/5ML syrup; Take 5 mLs by mouth every 8 (eight) hours as needed for cough.  Dispense: 120 mL; Refill: 0 - HIV antibody (with reflex) -  CBC with Differential   Has appointment with Dr. Sherene Sires, pulmonology on June 6.  Will get lab work today.  Cough suppressant sent to pharmacy.  We will complete antibiotic course as prescribed on May 9.  Agrees with plan of care discussed today. Understands warning signs to seek further care: chest pain, shortness of breath, any significant change in health.  Understands to follow-up with pulmonology, with this office sooner if needed.  FMLA paperwork to be completed by this provider.  Will notify once results of lab work become available.  Dorena Bodo, NP 03/08/21

## 2021-03-08 NOTE — Telephone Encounter (Signed)
Patient had FMLA faxed over to be completed in your box. ?

## 2021-03-09 ENCOUNTER — Encounter: Payer: Self-pay | Admitting: Family Medicine

## 2021-03-09 LAB — CBC WITH DIFFERENTIAL/PLATELET
Basophils Absolute: 0.1 10*3/uL (ref 0.0–0.2)
Basos: 1 %
EOS (ABSOLUTE): 0.2 10*3/uL (ref 0.0–0.4)
Eos: 2 %
Hematocrit: 48 % (ref 37.5–51.0)
Hemoglobin: 16.3 g/dL (ref 13.0–17.7)
Immature Grans (Abs): 0 10*3/uL (ref 0.0–0.1)
Immature Granulocytes: 0 %
Lymphocytes Absolute: 2.6 10*3/uL (ref 0.7–3.1)
Lymphs: 29 %
MCH: 27.7 pg (ref 26.6–33.0)
MCHC: 34 g/dL (ref 31.5–35.7)
MCV: 82 fL (ref 79–97)
Monocytes Absolute: 0.8 10*3/uL (ref 0.1–0.9)
Monocytes: 9 %
Neutrophils Absolute: 5.1 10*3/uL (ref 1.4–7.0)
Neutrophils: 59 %
Platelets: 282 10*3/uL (ref 150–450)
RBC: 5.89 x10E6/uL — ABNORMAL HIGH (ref 4.14–5.80)
RDW: 13.1 % (ref 11.6–15.4)
WBC: 8.9 10*3/uL (ref 3.4–10.8)

## 2021-03-09 LAB — HIV ANTIBODY (ROUTINE TESTING W REFLEX): HIV Screen 4th Generation wRfx: NONREACTIVE

## 2021-03-10 ENCOUNTER — Telehealth: Payer: Self-pay

## 2021-03-10 NOTE — Telephone Encounter (Signed)
Patient had short-term disability faxed over to be completed in your box

## 2021-03-17 ENCOUNTER — Telehealth: Payer: Self-pay | Admitting: Family Medicine

## 2021-03-17 NOTE — Telephone Encounter (Signed)
Patient job has sent over more FMLA to be completed in your folder with copy of Clydie Braun old form she done with it.

## 2021-03-18 NOTE — Telephone Encounter (Signed)
Please let him know that we are trying to fill out his work forms that [employer] sent [place].  They are quite detailed and require some detailed input from the patient as well as [place] will need a few questions answered to help appropriately fill this out  #1-currently is the patient back to work?  If he is back to work when did he go back to work? If he is still out of work what is the main reason he is still out of work? (If he is out of work what type of symptoms is he having currently that would preclude him from being able to work) If he is out of work does he feel he will be able to go back to work currently? Make sure patient is aware of his appointment with pulmonary on 6 June (If he is still out of work and states he is unable to return to work then more than likely we will take him out through his appointment with the pulmonologist on the 6 then from there It would be purely up to the pulmonologist if he needs to stay out of work longer)  Thank you for getting the additional information then return to me thank you

## 2021-03-18 NOTE — Telephone Encounter (Signed)
1- Patient is NOT  back at work and has been out of work since 02/18/21  2- Breathing issues keep him from being able to stan in one place or walk around- feels wobbly  3- Patient doesn't not currently foresee the ability to return to work anytime soon if ever  4- Patient is aware of pulmonology appt and that they will need to take over the work papers after his visit.

## 2021-03-18 NOTE — Telephone Encounter (Signed)
Left message to return call 

## 2021-03-22 NOTE — Telephone Encounter (Signed)
Forms were filled out as best as I could please review and add appropriate information regarding ICD codes then forward to the front to put in administrative part then make sure it gets faxed then, patient notified, copy put into the files thank you  Also please make sure the patient understands that these disability forms takes him out through 6 June then it will be up to pulmonary regarding when he returns to work

## 2021-03-24 ENCOUNTER — Telehealth: Payer: Self-pay

## 2021-03-24 NOTE — Telephone Encounter (Signed)
Patient has been made aware per drs notes and recommendations regarding fmla and follow up with pulmonology . He verbalized understanding.

## 2021-03-24 NOTE — Telephone Encounter (Signed)
Left message to return call 

## 2021-03-29 ENCOUNTER — Encounter: Payer: Self-pay | Admitting: Internal Medicine

## 2021-03-29 ENCOUNTER — Other Ambulatory Visit: Payer: Self-pay

## 2021-03-29 ENCOUNTER — Ambulatory Visit (INDEPENDENT_AMBULATORY_CARE_PROVIDER_SITE_OTHER): Payer: 59 | Admitting: Internal Medicine

## 2021-03-29 DIAGNOSIS — R058 Other specified cough: Secondary | ICD-10-CM | POA: Insufficient documentation

## 2021-03-29 MED ORDER — FAMOTIDINE 20 MG PO TABS: ORAL_TABLET | ORAL | 11 refills | Status: DC

## 2021-03-29 MED ORDER — TRAMADOL HCL 50 MG PO TABS: 50.0000 mg | ORAL_TABLET | ORAL | 0 refills | Status: AC | PRN

## 2021-03-29 MED ORDER — PREDNISONE 10 MG PO TABS: ORAL_TABLET | ORAL | 0 refills | Status: DC

## 2021-03-29 MED ORDER — PANTOPRAZOLE SODIUM 40 MG PO TBEC: 40.0000 mg | DELAYED_RELEASE_TABLET | Freq: Every day | ORAL | 2 refills | Status: DC

## 2021-03-29 NOTE — Patient Instructions (Addendum)
The key to effective treatment for your cough is eliminating the non-stop cycle of cough you're stuck in long enough to let your airway heal completely and then see if there is anything still making you cough once you stop the cough suppression, but this should take no more than 5 days to figure out  First take delsym two tsp every 12 hours and supplement if needed with  tramadol 50 mg up to 2 every 4 hours to suppress the urge to cough at all or even clear your throat. Swallowing water or using ice chips/non mint and menthol containing candies (such as lifesavers or sugarless jolly ranchers) are also effective.  You should rest your voice and avoid activities that you know make you cough.  Once you have eliminated the cough for 3 straight days try reducing the tramadol first,  then the delsym as tolerated.    Prednisone 10 mg take  4 each am x 2 days,   2 each am x 2 days,  1 each am x 2 days and stop (this is to eliminate allergies and inflammation from coughing)  Protonix (pantoprazole) Take 30-60 min before first meal of the day and Pepcid 20 mg one bedtime plus chlorpheniramine 4 mg x 2 at bedtime (both available over the counter)  until cough is completely gone for at least a week without the need for cough suppression  GERD (REFLUX)  is an extremely common cause of respiratory symptoms, many times with no significant heartburn at all.    It can be treated with medication, but also with lifestyle changes including avoidance of late meals, excessive alcohol, smoking cessation, and avoid fatty foods, chocolate, peppermint, colas, red wine, and acidic juices such as orange juice.  NO MINT OR MENTHOL PRODUCTS SO NO COUGH DROPS   USE HARD CANDY INSTEAD (jolley ranchers or Stover's or Lifesavers (all available in sugarless versions) NO OIL BASED VITAMINS - use powdered substitutes.   Return to clinic in [place] in 2 weeks  - bring all active medications

## 2021-03-29 NOTE — Assessment & Plan Note (Addendum)
Onset viral late April 2022  - cyclical cough rx 03/29/2021 >>>  Upper airway cough syndrome (previously labeled PNDS),  is so named because it's frequently impossible to sort out how much is  CR/sinusitis with freq throat clearing (which can be related to primary GERD)   vs  causing  secondary (" extra esophageal")  GERD from wide swings in gastric pressure that occur with throat clearing, often  promoting self use of mint and menthol lozenges that reduce the lower esophageal sphincter tone and exacerbate the problem further in a cyclical fashion.   These are the same pts (now being labeled as having "irritable larynx syndrome" by some cough centers) who not infrequently have a history of having failed to tolerate ace inhibitors,  dry powder inhalers or biphosphonates or report having atypical/extraesophageal reflux symptoms that don't respond to standard doses of PPI  and are easily confused as having aecopd or asthma flares by even experienced allergists/ pulmonologists (myself included).   Of the three most common causes of  Sub-acute / recurrent or chronic cough, only one (GERD)  can actually contribute to/ trigger  the other two (asthma and post nasal drip syndrome)  and perpetuate the cylce of cough.  While not intuitively obvious, many patients with chronic low grade reflux do not cough until there is a primary insult that disturbs the protective epithelial barrier and exposes sensitive nerve endings.   This is typically viral but can due to PNDS and  either may apply here.     >>>The point is that once this occurs, it is difficult to eliminate the cycle  using anything but a maximally effective acid suppression regimen at least in the short run, accompanied by an appropriate diet to address non acid GERD and control / eliminate pnds with 1st gen H1 blockers per guidelines  And  the cough itself for at least 3 days with delsym/tramadol if tolerates  >>> also so added 6 day taper off  Prednisone  starting at 40 mg per day in case of component of Th-2 driven upper or lower airways inflammation (if cough responds short term only to relapse before return while will on full rx for uacs (as above), then  that would point to allergic rhinitis/ asthma or eos bronchitis as alternative dx)        >> f/u in 2 weeks in [place] office with all meds in hand using a trust but verify approach to confirm accurate Medication  Reconciliation The principal here is that until we are certain that the  patients are doing what we've asked, it makes no sense to ask them to do more.   Each maintenance medication was reviewed in detail including emphasizing most importantly the difference between maintenance and prns and under what circumstances the prns are to be triggered using an action plan format where appropriate.  Total time for H and P, chart review, counseling, and generating customized AVS unique to this office visit / same day charting = 55 min

## 2021-03-29 NOTE — Progress Notes (Signed)
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 and bad asthma age [age] with allergy testing age 24 by positive mostly foods > treatment special diet / nebs last used around age 65 but since daily symptoms of runny nose, nasal congestion seemed less severe and no problem with colds after the age of 27 did fine until 02/17/21 onset cough, aches, sweats, hoarseness > covid neg multiple times though mother tested positive 3 weeks prior to illness > mullitple visits with Dr Gerda Diss for rx for asthma/ bronchitis/ rhinitis  No better so referred to pulmonary clinic 03/29/2021 by Dr   Gerda Diss     History of Present Illness  03/29/2021  Pulmonary/ 1st office eval/Dale Lopez  Chief Complaint  Patient presents with  . Pulmonary Consult    Referred by Dr Lilyan Punt. Pt c/o cough and SOB since 02/18/21. His cough is prod with clear sputum. It's not waking him up in the night.   Dyspnea  coughing problem makes him sob  Cough  harsh / minimal mucoid Sleep:does better sleeping  SABA use: not helping  gen ant chest discomfort from cough to point of gagging   No obvious other patterns in day to day or daytime variability or assoc excess/ purulent sputum or mucus plugs or hemoptysis or   chest tightness, subjective wheeze or overt   hb symptoms.     Also denies any obvious fluctuation of symptoms with weather or environmental changes or other aggravating or alleviating factors except as outlined above   No unusual exposure hx or h/o childhood pna.  Current Allergies, Complete Past Medical History, Past Surgical History, Family History, and Social History were reviewed in [name] record.  ROS  The following are not active complaints unless bolded Hoarseness, sore throat, dysphagia, dental problems, itching, sneezing,  nasal congestion or discharge of excess mucus or  purulent secretions, ear ache,   fever, chills, sweats, unintended wt loss or wt gain, classically pleuritic or exertional cp,  orthopnea pnd or arm/hand swelling  or leg swelling, presyncope, palpitations, abdominal pain, anorexia, nausea, vomiting, diarrhea  or change in bowel habits or change in bladder habits, change in stools or change in urine, dysuria, hematuria,  rash, arthralgias, visual complaints, headache, numbness, weakness or ataxia or problems with walking or coordination,  change in mood or  memory.           No past medical history on file.  Outpatient Medications Prior to Visit  Medication Sig Dispense Refill  . albuterol (VENTOLIN HFA) 108 (90 Base) MCG/ACT inhaler Inhale 2 puffs into the lungs every 4 (four) hours as needed for wheezing. 1 each 2  . budesonide-formoterol (SYMBICORT) 80-4.5 MCG/ACT inhaler Inhale 2 puffs into the lungs 2 (two) times daily. 1 each 1  . fluticasone (FLONASE) 50 MCG/ACT nasal spray Place into both nostrils daily.    [name] ibuprofen (ADVIL) 200 MG tablet Take 200 mg by mouth every 6 (six) hours as needed.    . loratadine (CLARITIN) 10 MG tablet Take 10 mg by mouth daily.    . benzonatate (TESSALON) 100 MG capsule Take 1 capsule (100 mg total) by mouth 2 (two) times daily as needed for cough. 20 capsule 0  . clarithromycin (BIAXIN) 250 MG tablet Take 1 tablet (250 mg total) by mouth 2 (two) times daily. 20 tablet 0  .  HYDROcodone bit-homatropine (HYCODAN) 5-1.5 MG/5ML syrup Take 5 mLs by mouth every 8 (eight) hours as needed for cough. 120 mL 0   No facility-administered medications prior to visit.     Objective:     BP 90/64 (BP Location: Left Arm, Cuff Size: Normal)   Pulse 94   Temp 98.6 F (37 C) (Temporal)   Ht 5\' 4"  (1.626 m)   Wt 115 lb 6.4 oz (52.3 kg)   SpO2 98% Comment: on RA  BMI 19.81 kg/m   SpO2: 98 % (on RA)   amb thin wm incessant extremely harsh dry upper airway cough pattern    HEENT : pt wearing mask not removed for  exam due to covid -19 concerns.    NECK :  without JVD/Nodes/TM/ nl carotid upstrokes bilaterally   LUNGS: no acc muscle use,  Nl contour chest which is clear to A and P bilaterally without cough on insp or exp maneuvers   CV:  RRR  no s3 or murmur or increase in P2, and no edema   ABD:  soft and nontender with nl inspiratory excursion in the supine position. No bruits or organomegaly appreciated, bowel sounds nl  MS:  Nl gait/ ext warm without deformities, calf tenderness, cyanosis or clubbing No obvious joint restrictions   SKIN: warm and dry without lesions    NEURO:  alert, approp, nl sensorium with  no motor or cerebellar deficits apparent.      I personally reviewed images and agree with radiology impression as follows:  CXR:   02/19/21 No active cardiopulmonary disease.       Assessment   Upper airway cough syndrome Onset viral late April 2022  - cyclical cough rx 03/29/2021 >>>  Upper airway cough syndrome (previously labeled PNDS),  is so named because it's frequently impossible to sort out how much is  CR/sinusitis with freq throat clearing (which can be related to primary GERD)   vs  causing  secondary (" extra esophageal")  GERD from wide swings in gastric pressure that occur with throat clearing, often  promoting self use of mint and menthol lozenges that reduce the lower esophageal sphincter tone and exacerbate the problem further in a cyclical fashion.   These are the same pts (now being labeled as having "irritable larynx syndrome" by some cough centers) who not infrequently have a history of having failed to tolerate ace inhibitors,  dry powder inhalers or biphosphonates or report having atypical/extraesophageal reflux symptoms that don't respond to standard doses of PPI  and are easily confused as having aecopd or asthma flares by even experienced allergists/ pulmonologists (myself included).   Of the three most common causes of  Sub-acute / recurrent or  chronic cough, only one (GERD)  can actually contribute to/ trigger  the other two (asthma and post nasal drip syndrome)  and perpetuate the cylce of cough.  While not intuitively obvious, many patients with chronic low grade reflux do not cough until there is a primary insult that disturbs the protective epithelial barrier and exposes sensitive nerve endings.   This is typically viral but can due to PNDS and  either may apply here.     >>>The point is that once this occurs, it is difficult to eliminate the cycle  using anything but a maximally effective acid suppression regimen at least in the short run, accompanied by an appropriate diet to address non acid GERD and control / eliminate pnds with 1st gen H1 blockers per guidelines  And  the cough itself for at least 3 days with delsym/tramadol if tolerates  >>> also so added 6 day taper off  Prednisone starting at 40 mg per day in case of component of Th-2 driven upper or lower airways inflammation (if cough responds short term only to relapse before return while will on full rx for uacs (as above), then  that would point to allergic rhinitis/ asthma or eos bronchitis as alternative dx)        >> f/u in 2 weeks in [place] office with all meds in hand using a trust but verify approach to confirm accurate Medication  Reconciliation The principal here is that until we are certain that the  patients are doing what we've asked, it makes no sense to ask them to do more.   Each maintenance medication was reviewed in detail including emphasizing most importantly the difference between maintenance and prns and under what circumstances the prns are to be triggered using an action plan format where appropriate.  Total time for H and P, chart review, counseling, and generating customized AVS unique to this office visit / same day charting = 55 min         Sandrea Hughs, MD 03/29/2021

## 2021-03-30 NOTE — Telephone Encounter (Signed)
Dr Sherene Sires,  We received this mychart message from patient:   Hello Dr. Sherene Sires,  I'm messaging you to request a work note. The one recieved from the front desk does not have a duration for being out. I would like one that has today until when I should return. I appreciate your time and attention, have a nice day.  Dale Lopez  Dr Sherene Sires please advise.

## 2021-04-01 NOTE — Telephone Encounter (Signed)
Sent Dr Thurston Hole response back to patient. Will await for response from patient.

## 2021-04-01 NOTE — Telephone Encounter (Signed)
Whenever he feels he can go back is fine with me

## 2021-04-01 NOTE — Telephone Encounter (Signed)
Work note written per Dr Sherene Sires and sent through [facility] to patient. Patient made aware. Nothing further needed at this time.

## 2021-04-21 ENCOUNTER — Ambulatory Visit: Payer: 59 | Admitting: Internal Medicine

## 2021-04-23 ENCOUNTER — Other Ambulatory Visit: Payer: Self-pay | Admitting: Family Medicine

## 2021-08-12 ENCOUNTER — Ambulatory Visit
Admission: EM | Admit: 2021-08-12 | Discharge: 2021-08-12 | Disposition: A | Discharge status: Home / Self Care | Payer: 59 | Attending: Emergency Medicine | Admitting: Emergency Medicine

## 2021-08-12 ENCOUNTER — Encounter: Payer: Self-pay | Admitting: Emergency Medicine

## 2021-08-12 ENCOUNTER — Other Ambulatory Visit: Payer: Self-pay

## 2021-08-12 DIAGNOSIS — L089 Local infection of the skin and subcutaneous tissue, unspecified: Secondary | ICD-10-CM | POA: Insufficient documentation

## 2021-08-12 DIAGNOSIS — L720 Epidermal cyst: Secondary | ICD-10-CM | POA: Diagnosis present

## 2021-08-12 MED ORDER — DOXYCYCLINE HYCLATE 100 MG PO CAPS: 100.0000 mg | ORAL_CAPSULE | Freq: Two times a day (BID) | ORAL | 0 refills | Status: AC

## 2021-08-12 MED ORDER — IBUPROFEN 600 MG PO TABS: 600.0000 mg | ORAL_TABLET | Freq: Four times a day (QID) | ORAL | 0 refills | Status: DC | PRN

## 2021-08-12 NOTE — ED Provider Notes (Signed)
HPI  SUBJECTIVE:  Dale Lopez is a 25 y.o. male who presents with a mass on his back for the past year.  It started getting bigger, erythematous and painful 1 to 2 weeks ago.  No fevers, body aches.  He had his mother, who is a Engineer, civil (consulting), drain it 3 days ago with a sterile scalpel, and was able to get some brownish pus and foul-smelling thick material out of it.  It has not changed in size since he lanced it.  No antipyretic in the past 6 hours.  He has been taking ibuprofen 400 mg every 6 hours without improvement in his symptoms.  Symptoms are worse with palpation.  Past medical history negative for MRSA.  JKD:TOIZTI, Jonna Coup, MD He has a dermatology appointment in 4 days.   History reviewed. No pertinent past medical history.  Past Surgical History:  Procedure Laterality Date   WISDOM TOOTH EXTRACTION      Family History  Problem Relation Age of Onset   Asthma Father    Allergies Father     Social History   Tobacco Use   Smoking status: Never   Smokeless tobacco: Never  Vaping Use   Vaping Use: Never used  Substance Use Topics   Alcohol use: Yes    Comment: rarely   Drug use: No    No current facility-administered medications for this encounter.  Current Outpatient Medications:    doxycycline (VIBRAMYCIN) 100 MG capsule, Take 1 capsule (100 mg total) by mouth 2 (two) times daily for 5 days., Disp: 10 capsule, Rfl: 0   ibuprofen (ADVIL) 600 MG tablet, Take 1 tablet (600 mg total) by mouth every 6 (six) hours as needed., Disp: 30 tablet, Rfl: 0  Allergies  Allergen Reactions   Hydrocodone    Other     Allergic to nuts per patient     ROS  As noted in HPI.   Physical Exam  BP 118/78 (BP Location: Right Arm)   Pulse 82   Temp 98.1 F (36.7 C) (Oral)   Resp 16   SpO2 99%   Constitutional: Well developed, well nourished, no acute distress Eyes:  EOMI, conjunctiva normal bilaterally HENT: Normocephalic, atraumatic,mucus membranes moist Respiratory: Normal  inspiratory effort Cardiovascular: Normal rate GI: nondistended skin: 3 x 3 cm tender area of erythema/induration with central fluctuance above left buttock.  No expressible purulent drainage   Musculoskeletal: no deformities Neurologic: Alert & oriented x 3, no focal neuro deficits Psychiatric: Speech and behavior appropriate   ED Course   Medications - No data to display  Orders Placed This Encounter  Procedures   Aerobic Culture w Gram Stain (superficial specimen)    Standing Status:   Standing    Number of Occurrences:   1    No results found for this or any previous visit (from the past 24 hour(s)). No results found.  ED Clinical Impression  1. Infected epithelial inclusion cyst      ED Assessment/Plan  Procedure note: Cleaned area with alcohol.  Used 1 cc of 2% lidocaine with epinephrine, performed local anesthesia via infiltration with adequate anesthesia.  Made a single stab incision, expressed a copious amount of foul-smelling cheesy material and pus.  Explored wound to break up loculations.  Then irrigated out with 90 cc of sterile saline.  Culture sent.  Did not place packing.  Dressing placed.  Patient tolerated procedure well.  Home with doxycycline, Tylenol/ibuprofen.   will tailor antibiotic therapy if necessary based on culture results.  He has follow-up with his dermatologist on Monday.  May return here if he gets worse.  Discussed labs, imaging, MDM, treatment plan, and plan for follow-up with patient.  patient agrees with plan.   Meds ordered this encounter  Medications   ibuprofen (ADVIL) 600 MG tablet    Sig: Take 1 tablet (600 mg total) by mouth every 6 (six) hours as needed.    Dispense:  30 tablet    Refill:  0   doxycycline (VIBRAMYCIN) 100 MG capsule    Sig: Take 1 capsule (100 mg total) by mouth 2 (two) times daily for 5 days.    Dispense:  10 capsule    Refill:  0      *This clinic note was created using Scientist, clinical (histocompatibility and immunogenetics).  Therefore, there may be occasional mistakes despite careful proofreading.  ?    Domenick Gong, MD 08/13/21 [phone]

## 2021-08-12 NOTE — ED Triage Notes (Signed)
Boil on left lower back x 1 week.  States recently area has become larger

## 2021-08-12 NOTE — Discharge Instructions (Addendum)
Finish the antibiotics, even if you feel better.  Give [place] a working phone number so that we contact you if we need to change your antibiotics. Take the medication as written. Take 1 gram of tylenol with the motrin up to 4 times a day as needed for pain and fever. This is an effective combination for pain.  Return to the ER if you get worse, have a persistent fever >100.4, or for any concerns.   Go to www.goodrx.com  or www.costplusdrugs.com to look up your medications. This will give you a list of where you can find your prescriptions at the most affordable prices. Or ask the pharmacist what the cash price is, or if they have any other discount programs available to help make your medication more affordable. This can be less expensive than what you would pay with insurance.

## 2021-08-15 LAB — AEROBIC CULTURE W GRAM STAIN (SUPERFICIAL SPECIMEN): Culture: NORMAL

## 2021-12-24 IMAGING — DX DG CHEST 2V
2 series · 2 of 2 positions shown · non-contrast
Comparison: 04/11/2014

CLINICAL DATA: Cough and decreased lung sounds.  Short of breath.

EXAM:
CHEST - 2 VIEW

[chest pa]
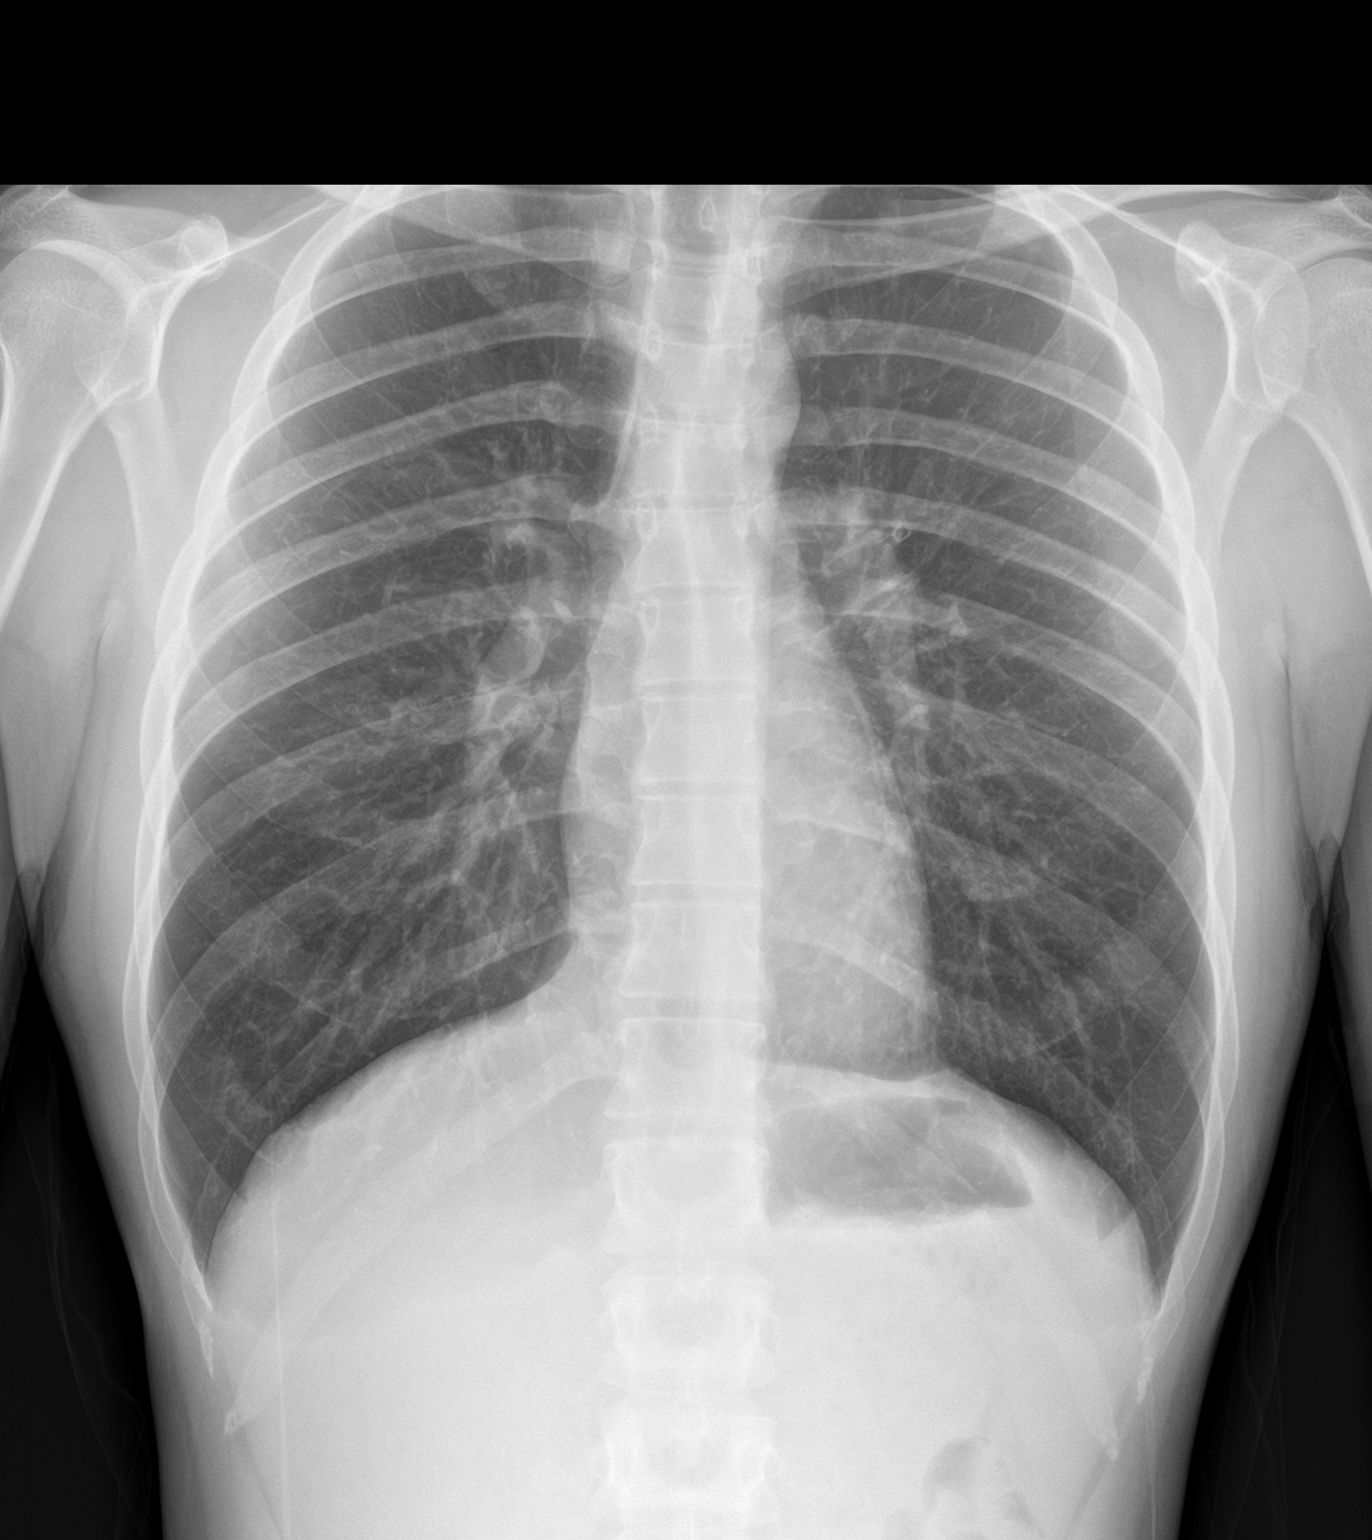

[chest lat]
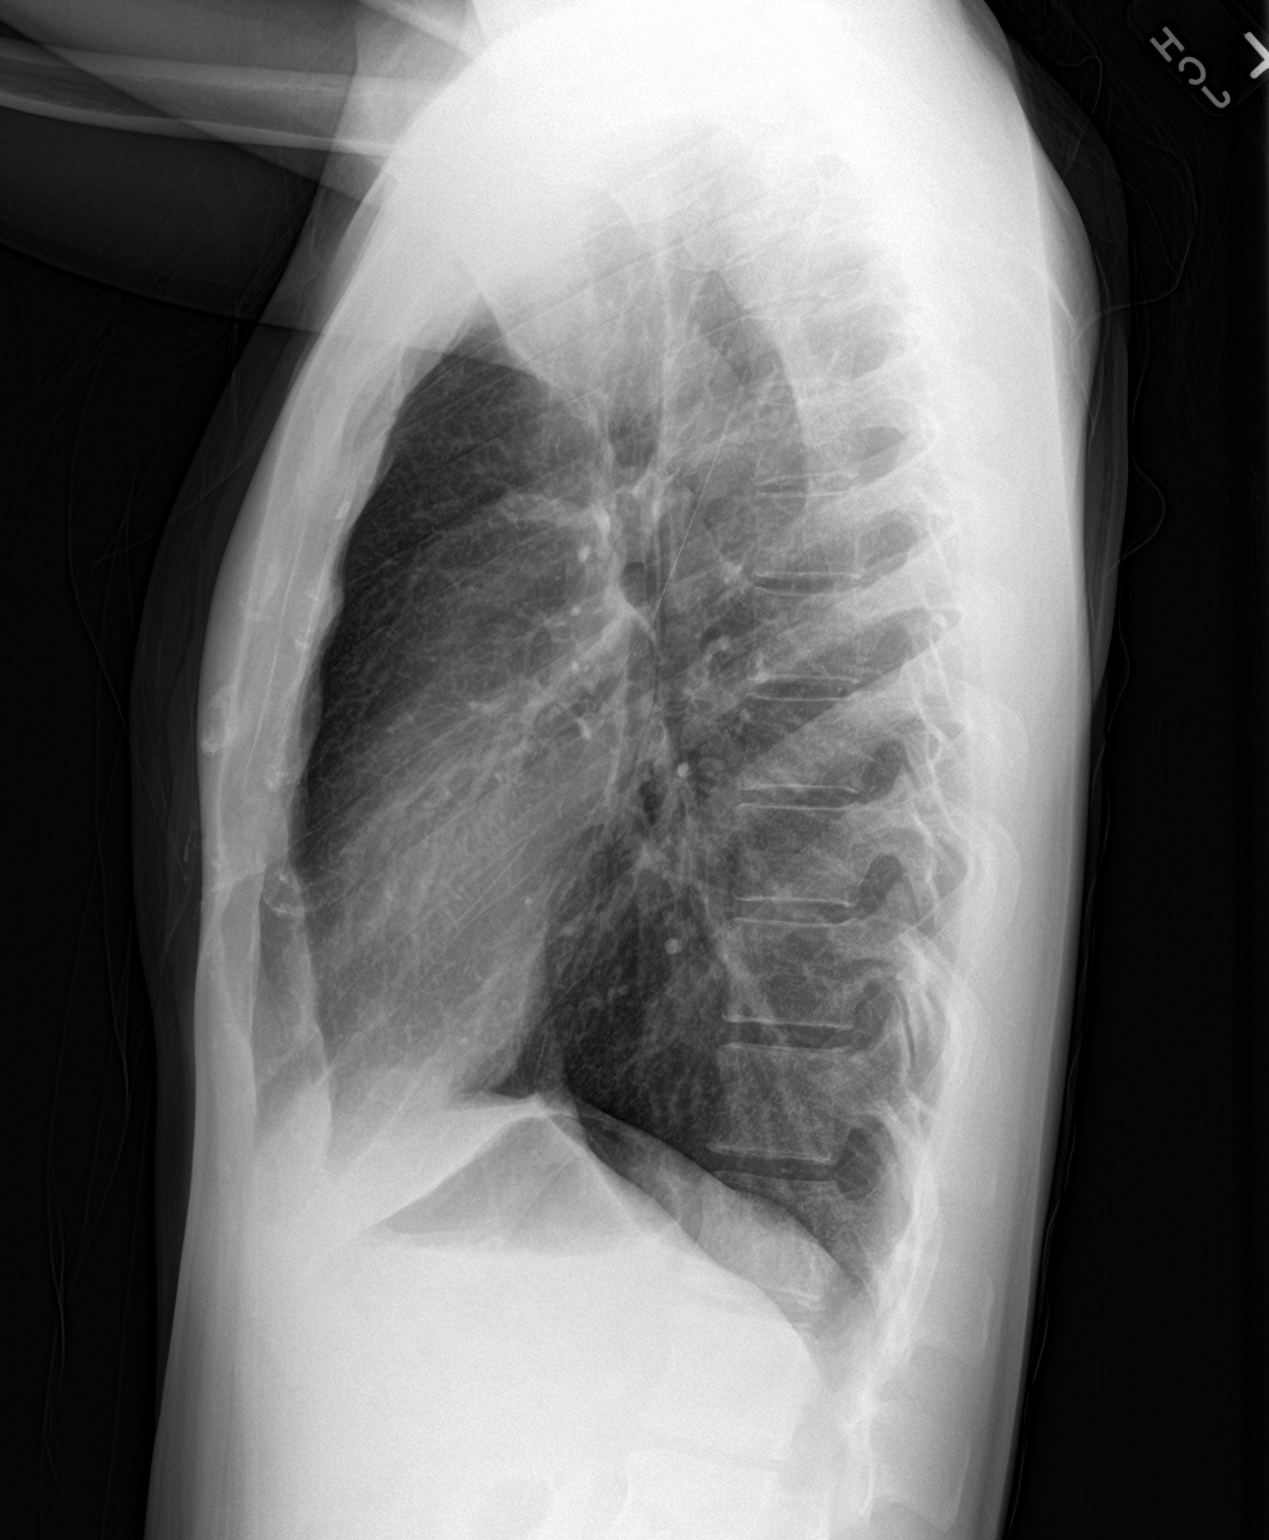

[2 of 2 positions shown; findings below may reference images not displayed]

FINDINGS: The heart size and mediastinal contours are within normal limits.
Both lungs are clear. The visualized skeletal structures are
unremarkable.
IMPRESSION: No active cardiopulmonary disease.

## 2022-02-09 DIAGNOSIS — A084 Viral intestinal infection, unspecified: Secondary | ICD-10-CM | POA: Diagnosis not present

## 2022-08-30 ENCOUNTER — Ambulatory Visit (INDEPENDENT_AMBULATORY_CARE_PROVIDER_SITE_OTHER): Payer: BC Managed Care – PPO | Admitting: Family Medicine

## 2022-08-30 ENCOUNTER — Encounter: Payer: Self-pay | Admitting: Family Medicine

## 2022-08-30 VITALS — BP 118/62 | Wt 121.4 lb

## 2022-08-30 DIAGNOSIS — Q386 Other congenital malformations of mouth: Secondary | ICD-10-CM | POA: Diagnosis not present

## 2022-08-30 NOTE — Progress Notes (Signed)
Subjective:  Patient ID: Dale Lopez, male    DOB: Aug 26, 1996  Age: 26 y.o. MRN: [id number]  CC: Chief Complaint  Patient presents with   Spots on Penis    Pt arrives due to nodule on penis. Pt states they have been there for a while but were recently noticed by another person.     HPI:  26 year old male presents for evaluation the above.  Patient reports that he has had some nodules/spots on his penis for the past 2 years.  They are just below the head of the penis.  They are not painful.  He states that his partner recently raise concern.  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   Upper airway cough syndrome 03/29/2021    Social Hx   Social History   Socioeconomic History   Marital status: Single    Spouse name: Not on file   Number of children: Not on file   Years of education: Not on file   Highest education level: Not on file  Occupational History   Not on file  Tobacco Use   Smoking status: Never   Smokeless tobacco: Never  Vaping Use   Vaping Use: Never used  Substance and Sexual Activity   Alcohol use: Yes    Comment: rarely   Drug use: No   Sexual activity: Not on file  Other Topics Concern   Not on file  Social History Narrative   Not on file   Social Determinants of Health   Financial Resource Strain: Not on file  Food Insecurity: Not on file  Transportation Needs: Not on file  Physical Activity: Not on file  Stress: Not on file  Social Connections: Not on file    Review of Systems Per HPI  Objective:  BP 118/62   Wt 121 lb 6.4 oz (55.1 kg)   BMI 20.84 kg/m      08/30/2022    8:06 AM 08/12/2021    8:11 AM 03/29/2021    3:29 PM  BP/Weight  Systolic BP 382 505 90  Diastolic BP 62 78 64  Wt. (Lbs) 121.4  115.4  BMI 20.84 kg/m2  19.81 kg/m2    Physical Exam Constitutional:      General: He is not in acute distress.    Appearance: Normal appearance.  HENT:     Head: Normocephalic and  atraumatic.  Pulmonary:     Effort: Pulmonary effort is normal. No respiratory distress.  Genitourinary:    Comments: Few scattered white lesions just below the head of the penis.  No evidence of vesicular lesions, warty lesions, or ulcerations. Neurological:     Mental Status: He is alert.    Lab Results  Component Value Date   WBC 8.9 03/08/2021   HGB 16.3 03/08/2021   HCT 48.0 03/08/2021   PLT 282 03/08/2021   GLUCOSE 71 04/30/2014   ALT 59 (H) 04/30/2014   AST 30 04/30/2014   NA 140 04/30/2014   K 4.7 04/30/2014   CL 105 04/30/2014   CREATININE 0.99 04/30/2014   BUN 10 04/30/2014   CO2 27 04/30/2014     Assessment & Plan:   Problem List Items Addressed This Visit       Digestive   Fordyce spots - Primary    The areas of concern appear benign.  Most consistent with Fordyce spots.  Patient educated that this is nonsexually transmitted.  This is essentially benign/normal.  Advised safe sex.  [place]

## 2022-08-30 NOTE — Patient Instructions (Addendum)
These areas are benign. No evidence of syphilis, herpes or genital warts.   These are most consistent with fordyce spots.  No need for your partner to be concerned.  Take care  Dr. Lacinda Axon

## 2022-08-30 NOTE — Assessment & Plan Note (Signed)
The areas of concern appear benign.  Most consistent with Fordyce spots.  Patient educated that this is nonsexually transmitted.  This is essentially benign/normal.  Advised safe sex.

## 2022-09-05 ENCOUNTER — Telehealth: Payer: Self-pay | Admitting: *Deleted

## 2022-09-05 ENCOUNTER — Ambulatory Visit (INDEPENDENT_AMBULATORY_CARE_PROVIDER_SITE_OTHER): Payer: BC Managed Care – PPO | Admitting: Nurse Practitioner

## 2022-09-05 DIAGNOSIS — U071 COVID-19: Secondary | ICD-10-CM | POA: Diagnosis not present

## 2022-09-05 NOTE — Telephone Encounter (Signed)
Mr. Dale Lopez, Dale Lopez are scheduled for a virtual visit with your provider today.    Just as we do with appointments in the office, we must obtain your consent to participate.  Your consent will be active for this visit and any virtual visit you may have with one of our providers in the next 365 days.    If you have a MyChart account, I can also send a copy of this consent to you electronically.  All virtual visits are billed to your insurance company just like a traditional visit in the office.  As this is a virtual visit, video technology does not allow for your provider to perform a traditional examination.  This may limit your provider's ability to fully assess your condition.  If your provider identifies any concerns that need to be evaluated in person or the need to arrange testing such as labs, EKG, etc, we will make arrangements to do so.    Although advances in technology are sophisticated, we cannot ensure that it will always work on either your end or our end.  If the connection with a video visit is poor, we may have to switch to a telephone visit.  With either a video or telephone visit, we are not always able to ensure that we have a secure connection.   I need to obtain your verbal consent now.   Are you willing to proceed with your visit today?   [name] has provided verbal consent on 09/05/2022 for a virtual visit (video or telephone).   Kathleen Lime, RN 09/05/2022  8:39 AM

## 2022-09-05 NOTE — Progress Notes (Unsigned)
   Subjective:    Patient ID: Dale Lopez, male    DOB: [birth year], 26 y.o.   MRN: [id number]  HPI Patient arrives to discuss Covid positive results yesterday. Patient states he started yesterday with fever, cough, congestion, sore throat and loss of taste.  Virtual Visit via Telephone Note  I connected with Dominick Morella on 09/05/22 at  8:40 AM EST by telephone and verified that I am speaking with the correct person using two identifiers.  Location: Patient: home Provider: office   I discussed the limitations, risks, security and privacy concerns of performing an evaluation and management service by telephone and the availability of in person appointments. I also discussed with the patient that there may be a patient responsible charge related to this service. The patient expressed understanding and agreed to proceed.   History of Present Illness:    Observations/Objective:   Assessment and Plan:   Follow Up Instructions:    I discussed the assessment and treatment plan with the patient. The patient was provided an opportunity to ask questions and all were answered. The patient agreed with the plan and demonstrated an understanding of the instructions.   The patient was advised to call back or seek an in-person evaluation if the symptoms worsen or if the condition fails to improve as anticipated.  I provided *** minutes of non-face-to-face time during this encounter.    Review of Systems     Objective:   Physical Exam        Assessment & Plan:

## 2022-09-07 ENCOUNTER — Encounter: Payer: Self-pay | Admitting: Nurse Practitioner

## 2022-11-21 ENCOUNTER — Telehealth: Payer: BC Managed Care – PPO | Admitting: Nurse Practitioner

## 2022-11-21 DIAGNOSIS — J40 Bronchitis, not specified as acute or chronic: Secondary | ICD-10-CM

## 2022-11-21 MED ORDER — ALBUTEROL SULFATE HFA 108 (90 BASE) MCG/ACT IN AERS: 2.0000 | INHALATION_SPRAY | Freq: Four times a day (QID) | RESPIRATORY_TRACT | 0 refills | Status: DC | PRN

## 2022-11-21 MED ORDER — AZITHROMYCIN 250 MG PO TABS: ORAL_TABLET | ORAL | 0 refills | Status: AC

## 2022-11-21 NOTE — Progress Notes (Signed)
We are sorry that you are not feeling well.  Here is how we plan to help!  Based on your presentation I believe you most likely have A cough due to bacteria.  When patients have a fever and a productive cough with a change in color or increased sputum production, we are concerned about bacterial bronchitis.  If left untreated it can progress to pneumonia.  If your symptoms do not improve with your treatment plan it is important that you contact your provider.   I have prescribed Azithromyin 250 mg: two tablets now and then one tablet daily for 4 additonal days    In addition you may use A non-prescription cough medication called Mucinex DM: take 2 tablets every 12 hours.  We will also prescribe an inhaler to help with the cough.  Meds ordered this encounter  Medications   azithromycin (ZITHROMAX) 250 MG tablet    Sig: Take 2 tablets on day 1, then 1 tablet daily on days 2 through 5    Dispense:  6 tablet    Refill:  0   albuterol (VENTOLIN HFA) 108 (90 Base) MCG/ACT inhaler    Sig: Inhale 2 puffs into the lungs every 6 (six) hours as needed for wheezing or shortness of breath.    Dispense:  8 g    Refill:  0     Be sure you are taking the antibiotic with food.  If you cough and fever persists please schedule a follow up with your primary care who we will copy on this note.    From your responses in the eVisit questionnaire you describe inflammation in the upper respiratory tract which is causing a significant cough.  This is commonly called Bronchitis and has four common causes:   Allergies Viral Infections Acid Reflux Bacterial Infection Allergies, viruses and acid reflux are treated by controlling symptoms or eliminating the cause. An example might be a cough caused by taking certain blood pressure medications. You stop the cough by changing the medication. Another example might be a cough caused by acid reflux. Controlling the reflux helps control the cough.  USE OF BRONCHODILATOR  ("RESCUE") INHALERS: There is a risk from using your bronchodilator too frequently.  The risk is that over-reliance on a medication which only relaxes the muscles surrounding the breathing tubes can reduce the effectiveness of medications prescribed to reduce swelling and congestion of the tubes themselves.  Although you feel brief relief from the bronchodilator inhaler, your asthma may actually be worsening with the tubes becoming more swollen and filled with mucus.  This can delay other crucial treatments, such as oral steroid medications. If you need to use a bronchodilator inhaler daily, several times per day, you should discuss this with your provider.  There are probably better treatments that could be used to keep your asthma under control.     HOME CARE Only take medications as instructed by your medical team. Complete the entire course of an antibiotic. Drink plenty of fluids and get plenty of rest. Avoid close contacts especially the very young and the elderly Cover your mouth if you cough or cough into your sleeve. Always remember to wash your hands A steam or ultrasonic humidifier can help congestion.   GET HELP RIGHT AWAY IF: You develop worsening fever. You become short of breath You cough up blood. Your symptoms persist after you have completed your treatment plan MAKE SURE YOU  Understand these instructions. Will watch your condition. Will get help right away if  you are not doing well or get worse.    Thank you for choosing an e-visit.  Your e-visit answers were reviewed by a board certified advanced clinical practitioner to complete your personal care plan. Depending upon the condition, your plan could have included both over the counter or prescription medications.  Please review your pharmacy choice. Make sure the pharmacy is open so you can pick up prescription now. If there is a problem, you may contact your provider through [employer] and have the prescription  routed to another pharmacy.  Your safety is important to [place]. If you have drug allergies check your prescription carefully.   For the next 24 hours you can use MyChart to ask questions about today's visit, request a non-urgent call back, or ask for a work or school excuse. You will get an email in the next two days asking about your experience. I hope that your e-visit has been valuable and will speed your recovery.   I spent approximately 7 minutes reviewing the patient's history, current symptoms and coordinating their plan of care today.

## 2022-12-13 ENCOUNTER — Other Ambulatory Visit: Payer: Self-pay | Admitting: Nurse Practitioner

## 2022-12-13 DIAGNOSIS — J40 Bronchitis, not specified as acute or chronic: Secondary | ICD-10-CM

## 2023-01-10 ENCOUNTER — Ambulatory Visit
Admission: EM | Admit: 2023-01-10 | Discharge: 2023-01-10 | Disposition: A | Discharge status: Home / Self Care | Payer: BC Managed Care – PPO | Attending: Nurse Practitioner | Admitting: Nurse Practitioner

## 2023-01-10 DIAGNOSIS — L02416 Cutaneous abscess of left lower limb: Secondary | ICD-10-CM | POA: Diagnosis not present

## 2023-01-10 MED ORDER — DOXYCYCLINE HYCLATE 100 MG PO CAPS: 100.0000 mg | ORAL_CAPSULE | Freq: Two times a day (BID) | ORAL | 0 refills | Status: AC

## 2023-01-10 NOTE — ED Notes (Signed)
Applied non-adherent dressing to left inner thigh and white med tape. Patient verbalizes that he understood how and when to change the dressing.

## 2023-01-10 NOTE — ED Provider Notes (Signed)
[facility]    CSN: [id number] Arrival date & time: 01/10/23  [id number]      History   Chief Complaint No chief complaint on file.   HPI Dale Lopez is a 27 y.o. male.   Patient presents today for abscess to left inner thigh that has been present for the past month.  Reports it has been growing in size, is red, and painful to touch.  He feels it is dark and hard.  No active drainage, denies any drainage from the area.  Reports history of similar to back a couple years ago and had to have it "lanced."  No fever, nausea/vomiting.  Orts history of ingrown hairs.    History reviewed. No pertinent past medical history.  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 Authorizing Provider  doxycycline (VIBRAMYCIN) 100 MG capsule Take 1 capsule (100 mg total) by mouth 2 (two) times daily for 7 days. 01/10/23 01/17/23 Yes Eulogio Bear, NP  albuterol (VENTOLIN HFA) 108 (90 Base) MCG/ACT inhaler Inhale 2 puffs into the lungs every 6 (six) hours as needed for wheezing or shortness of breath. 11/21/22   Apolonio Schneiders, FNP  ibuprofen (ADVIL) 600 MG tablet Take 1 tablet (600 mg total) by mouth every 6 (six) hours as needed. 08/12/21   Melynda Ripple, MD    Family History Family History  Problem Relation Age of Onset   Asthma Father    Allergies Father     Social History Social History   Tobacco Use   Smoking status: Never   Smokeless tobacco: Never  Vaping Use   Vaping Use: Never used  Substance Use Topics   Alcohol use: Yes    Comment: rarely   Drug use: No     Allergies   Hydrocodone and Other   Review of Systems Review of Systems Per HPI  Physical Exam Triage Vital Signs ED Triage  Vitals  Enc Vitals Group     BP 01/10/23 1037 117/77     Pulse Rate 01/10/23 1037 (!) 59     Resp 01/10/23 1037 20     Temp 01/10/23 1037 98 F (36.7 C)     Temp Source 01/10/23 1037 Oral     SpO2 01/10/23 1037 99 %     Weight --      Height --      Head Circumference --      Peak Flow --      Pain Score 01/10/23 1041 0     Pain Loc --      Pain Edu? --      Excl. in [place]? --    No data found.  Updated Vital Signs BP 117/77 (BP Location: Right Arm)   Pulse (!) 59   Temp 98 F (36.7 C) (Oral)   Resp 20   SpO2 99%   Visual Acuity Right Eye Distance:   Left Eye Distance:   Bilateral Distance:    Right Eye Near:   Left Eye Near:    Bilateral Near:     Physical Exam Vitals and nursing note reviewed.  Constitutional:      General: He is not in acute distress.  Appearance: Normal appearance. He is not toxic-appearing.  Pulmonary:     Effort: Pulmonary effort is normal. No respiratory distress.  Skin:    General: Skin is warm and dry.     Capillary Refill: Capillary refill takes less than 2 seconds.     Coloration: Skin is not jaundiced or pale.     Findings: Abscess and erythema present.          Comments: Indurated, erythematous abscess approximately 2 cm x 2.5 cm to left thigh in approximately area marked; area is tender to touch  Neurological:     Mental Status: He is alert and oriented to person, place, and time.  Psychiatric:        Behavior: Behavior is cooperative.      UC Treatments / Results  Labs (all labs ordered are listed, but only abnormal results are displayed) Labs Reviewed - No data to display  EKG   Radiology No results found.  Procedures Incision and Drainage  Date/Time: 01/10/2023 11:45 AM  Performed by: Eulogio Bear, NP Authorized by: Eulogio Bear, NP   Consent:    Consent obtained:  Verbal   Consent given by:  Patient   Risks, benefits, and alternatives were discussed: yes     Risks discussed:  Bleeding,  incomplete drainage, pain and infection   Alternatives discussed:  Alternative treatment Universal protocol:    Procedure explained and questions answered to patient or proxy's satisfaction: yes     Patient identity confirmed:  Verbally with patient Location:    Type:  Abscess   Size:  2.5 cm x 2 cm   Location:  Lower extremity   Lower extremity location:  Leg   Leg location:  L upper leg Pre-procedure details:    Skin preparation:  Antiseptic wash Sedation:    Sedation type:  None Anesthesia:    Anesthesia method:  Local infiltration   Local anesthetic:  Lidocaine 2% w/o epi Procedure type:    Complexity:  Simple Procedure details:    Ultrasound guidance: no     Incision types:  Stab incision   Incision depth:  Dermal   Wound management:  Probed and deloculated   Drainage:  Bloody   Drainage amount:  Moderate   Wound treatment:  Wound left open   Packing materials:  None Post-procedure details:    Procedure completion:  Tolerated well, no immediate complications  (including critical care time)  Medications Ordered in UC Medications - No data to display  Initial Impression / Assessment and Plan / UC Course  I have reviewed the triage vital signs and the nursing notes.  Pertinent labs & imaging results that were available during my care of the patient were reviewed by me and considered in my medical decision making (see chart for details).   Patient is well-appearing, normotensive, afebrile, not tachycardic, not tachypneic, oxygenating well on room air.    1. Abscess of left thigh I&D of abscess as above Wound care discussed with patient Start oral doxycycline Tylenol or ibuprofen as needed for pain control ER and return precautions discussed with patient Note given for work  The patient was given the opportunity to ask questions.  All questions answered to their satisfaction.  The patient is in agreement to this plan.    Final Clinical Impressions(s) / UC Diagnoses    Final diagnoses:  Abscess of left thigh     Discharge Instructions      We drained the abscess on your left thigh today  Please start cleaning the wound twice daily with warm soap and water Apply a clean nonadherent gauze and tape twice daily Start the oral antibiotic to treat underlying skin infection You can take Tylenol or ibuprofen as needed for pain or soreness Seek care if symptoms worsen or persist despite treatment    ED Prescriptions     Medication Sig Dispense Auth. Provider   doxycycline (VIBRAMYCIN) 100 MG capsule Take 1 capsule (100 mg total) by mouth 2 (two) times daily for 7 days. 14 capsule Eulogio Bear, NP      PDMP not reviewed this encounter.   Eulogio Bear, NP 01/10/23 1146

## 2023-01-10 NOTE — ED Triage Notes (Signed)
Pt reports he has an abscess on his left inner thigh that started x 1 month. It started small then gradually got bigger. States it is "dark and hard". Only painful to the touch.

## 2023-01-10 NOTE — Discharge Instructions (Addendum)
We drained the abscess on your left thigh today Please start cleaning the wound twice daily with warm soap and water Apply a clean nonadherent gauze and tape twice daily Start the oral antibiotic to treat underlying skin infection You can take Tylenol or ibuprofen as needed for pain or soreness Seek care if symptoms worsen or persist despite treatment

## 2023-02-13 DIAGNOSIS — D485 Neoplasm of uncertain behavior of skin: Secondary | ICD-10-CM | POA: Diagnosis not present

## 2023-02-13 DIAGNOSIS — L720 Epidermal cyst: Secondary | ICD-10-CM | POA: Diagnosis not present

## 2023-02-13 DIAGNOSIS — D2261 Melanocytic nevi of right upper limb, including shoulder: Secondary | ICD-10-CM | POA: Diagnosis not present

## 2023-02-13 DIAGNOSIS — D225 Melanocytic nevi of trunk: Secondary | ICD-10-CM | POA: Diagnosis not present

## 2023-04-24 ENCOUNTER — Ambulatory Visit (INDEPENDENT_AMBULATORY_CARE_PROVIDER_SITE_OTHER): Payer: BC Managed Care – PPO | Admitting: Family Medicine

## 2023-04-24 ENCOUNTER — Encounter: Payer: Self-pay | Admitting: Family Medicine

## 2023-04-24 VITALS — BP 118/76 | HR 73 | Temp 98.4°F | Ht 64.0 in | Wt 123.0 lb

## 2023-04-24 DIAGNOSIS — J029 Acute pharyngitis, unspecified: Secondary | ICD-10-CM

## 2023-04-24 LAB — POCT RAPID STREP A (OFFICE): Rapid Strep A Screen: NEGATIVE

## 2023-04-24 MED ORDER — BENZONATATE 200 MG PO CAPS: 200.0000 mg | ORAL_CAPSULE | Freq: Three times a day (TID) | ORAL | 0 refills | Status: DC | PRN

## 2023-04-24 MED ORDER — AZITHROMYCIN 250 MG PO TABS: ORAL_TABLET | ORAL | 0 refills | Status: AC

## 2023-04-24 NOTE — Progress Notes (Signed)
Subjective:  Patient ID: Dale Lopez, male    DOB: 1996-02-20  Age: 27 y.o. MRN: [id number]  CC: Chief Complaint  Patient presents with   Sore Throat    PT states his throat fells more itchy than sore but it also feels swollen    Cough    HPI:  27 year old male with a history of upper airway cough syndrome presents with the above complaints.  Patient reports multiple symptoms.  He reports that he has had an itchy throat and tonsil stones over the past 3 weeks.  No fever.  He has had cough and diarrhea as of yesterday.  No relieving factors.  Has a history of upper airway cough syndrome.  No other complaints or concerns at this time.  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   Upper airway cough syndrome 03/29/2021    Social Hx   Social History   Socioeconomic History   Marital status: Single    Spouse name: Not on file   Number of children: Not on file   Years of education: Not on file   Highest education level: Associate degree: academic program  Occupational History   Not on file  Tobacco Use   Smoking status: Never   Smokeless tobacco: Never  Vaping Use   Vaping Use: Never used  Substance and Sexual Activity   Alcohol use: Yes    Comment: rarely   Drug use: No   Sexual activity: Not on file  Other Topics Concern   Not on file  Social History Narrative   Not on file   Social Determinants of Health   Financial Resource Strain: Low Risk  (04/24/2023)   Overall Financial Resource Strain (CARDIA)    Difficulty of Paying Living Expenses: Not hard at all  Food Insecurity: No Food Insecurity (04/24/2023)   Hunger Vital Sign    Worried About Running Out of Food in the Last Year: Never true    Ran Out of Food in the Last Year: Never true  Transportation Needs: No Transportation Needs (04/24/2023)   PRAPARE - Administrator, Civil Service (Medical): No    Lack of Transportation (Non-Medical):  No  Physical Activity: Sufficiently Active (04/24/2023)   Exercise Vital Sign    Days of Exercise per Week: 4 days    Minutes of Exercise per Session: 60 min  Stress: No Stress Concern Present (04/24/2023)   [employer] of Occupational Health - Occupational Stress Questionnaire    Feeling of Stress : Not at all  Social Connections: Unknown (04/24/2023)   Social Connection and Isolation Panel [NHANES]    Frequency of Communication with Friends and Family: More than three times a week    Frequency of Social Gatherings with Friends and Family: Once a week    Attends Religious Services: Never    Database administrator or Organizations: No    Attends Engineer, structural: Not on file    Marital Status: Patient declined    Review of Systems Per HPI  Objective:  BP 118/76   Pulse 73   Temp 98.4 F (36.9 C)   Ht 5\' 4"  (1.626 m)   Wt 123 lb (55.8 kg)   SpO2 98%   BMI 21.11 kg/m      04/24/2023    4:13 PM 01/10/2023   10:37 AM 08/30/2022    8:06 AM  BP/Weight  Systolic BP 118 117 118  Diastolic BP 76 77 62  Wt. (Lbs) 123  121.4  BMI 21.11 kg/m2  20.84 kg/m2    Physical Exam Vitals and nursing note reviewed.  Constitutional:      General: He is not in acute distress.    Appearance: Normal appearance.  HENT:     Head: Normocephalic and atraumatic.     Right Ear: Tympanic membrane normal.     Left Ear: Tympanic membrane normal.     Mouth/Throat:     Pharynx: Posterior oropharyngeal erythema present.  Eyes:     General:        Right eye: No discharge.        Left eye: No discharge.     Conjunctiva/sclera: Conjunctivae normal.  Cardiovascular:     Rate and Rhythm: Normal rate and regular rhythm.  Pulmonary:     Effort: Pulmonary effort is normal.     Breath sounds: Normal breath sounds.  Lymphadenopathy:     Cervical: No cervical adenopathy.  Neurological:     Mental Status: He is alert.     Lab Results  Component Value Date   WBC 8.9 03/08/2021   HGB  16.3 03/08/2021   HCT 48.0 03/08/2021   PLT 282 03/08/2021   GLUCOSE 71 04/30/2014   ALT 59 (H) 04/30/2014   AST 30 04/30/2014   NA 140 04/30/2014   K 4.7 04/30/2014   CL 105 04/30/2014   CREATININE 0.99 04/30/2014   BUN 10 04/30/2014   CO2 27 04/30/2014     Assessment & Plan:   Problem List Items Addressed This Visit       Other   Sore throat - Primary    Patient with persistent sore throat over the past 3 weeks.  Rapid strep negative.  Associated cough.  Given duration of illness and lack of improvement, placing on azithromycin.  Tessalon Perles for cough.      Relevant Orders   Rapid Strep A (Completed)    Meds ordered this encounter  Medications   azithromycin (ZITHROMAX) 250 MG tablet    Sig: Take 2 tablets on day 1, then 1 tablet daily on days 2 through 5    Dispense:  6 tablet    Refill:  0   benzonatate (TESSALON) 200 MG capsule    Sig: Take 1 capsule (200 mg total) by mouth 3 (three) times daily as needed for cough.    Dispense:  30 capsule    Refill:  0    Follow-up:  Return if symptoms worsen or fail to improve.  Dale Other DO [facility]

## 2023-04-24 NOTE — Patient Instructions (Signed)
Rest. Fluids.  Strep negative.   Medication as directed.   Call with concerns.

## 2023-04-24 NOTE — Assessment & Plan Note (Signed)
Patient with persistent sore throat over the past 3 weeks.  Rapid strep negative.  Associated cough.  Given duration of illness and lack of improvement, placing on azithromycin.  Tessalon Perles for cough.

## 2023-06-14 ENCOUNTER — Ambulatory Visit
Admission: RE | Admit: 2023-06-14 | Discharge: 2023-06-14 | Disposition: A | Discharge status: Home / Self Care | Payer: BC Managed Care – PPO | Source: Ambulatory Visit | Attending: Family Medicine | Admitting: Family Medicine

## 2023-06-14 ENCOUNTER — Ambulatory Visit: Payer: BC Managed Care – PPO | Admitting: Family Medicine

## 2023-06-14 VITALS — BP 111/75 | HR 81 | Temp 98.8°F | Resp 12

## 2023-06-14 DIAGNOSIS — R09A2 Foreign body sensation, throat: Secondary | ICD-10-CM

## 2023-06-14 DIAGNOSIS — N4889 Other specified disorders of penis: Secondary | ICD-10-CM | POA: Diagnosis not present

## 2023-06-14 DIAGNOSIS — M62830 Muscle spasm of back: Secondary | ICD-10-CM | POA: Diagnosis not present

## 2023-06-14 MED ORDER — FAMOTIDINE 40 MG PO TABS: 40.0000 mg | ORAL_TABLET | Freq: Every day | ORAL | 0 refills | Status: DC

## 2023-06-14 NOTE — ED Triage Notes (Addendum)
Pt c/o lesion on the penis, "a lump" with redness x 2 weeks. Post intercourse is when it was first noticed after feeling a pain pt states he look and saw that it was "big and bleeding"  it has stopped bleeding but is still swollen and irritated at the site.

## 2023-06-14 NOTE — Discharge Instructions (Signed)
HOME CARE INSTRUCTIONS: For many people, back pain returns. Since low to mid back pain is rarely dangerous, it is often a condition that people can learn to manage on their own. Please remain active. It is stressful on the back to sit or stand in one place. Do not sit, drive, or stand in one place for more than 30 minutes at a time. Take short walks on level surfaces as soon as pain allows. Try to increase the length of time you walk each day. Do not stay in bed. Resting more than 1 or 2 days can delay your recovery. Do not avoid exercise or work. Your body is made to move. It is not dangerous to be active, even though your back may hurt. Your back will likely heal faster if you return to being active before your pain is gone. Over-the-counter medicines to reduce pain and inflammation are often the most helpful.  SEEK MEDICAL CARE IF: You have pain that is not relieved with rest or medicine. You have pain that does not improve in 1 week. You have new symptoms. You are generally not feeling well.  SEEK IMMEDIATE MEDICAL CARE IF: You have pain that radiates from your back into your legs. You develop new bowel or bladder control problems. You have unusual weakness or numbness in your arms or legs. You develop nausea or vomiting. You develop abdominal pain. You feel faint.

## 2023-06-22 NOTE — ED Provider Notes (Signed)
[facility]    ASSESSMENT & PLAN:  1. Penile irritation   2. Spasm of back muscles   3. Globus sensation    Begin: Meds ordered this encounter  Medications   famotidine (PEPCID) 40 MG tablet    Sig: Take 1 tablet (40 mg total) by mouth daily.    Dispense:  30 tablet    Refill:  0   Results for orders placed or performed in visit on 04/24/23  Rapid Strep A  Result Value Ref Range   Rapid Strep A Screen Negative Negative   No STD testing desired.  Urine culture sent. Will notify patient of any significant results. Ensure proper hydration. Will follow up with his PCP or here if not showing improvement over the next 48 hours, sooner if needed.  Outlined signs and symptoms indicating need for more acute intervention. Patient verbalized understanding. After Visit Summary given.  SUBJECTIVE:  Dale Lopez is a 27 y.o. male who complains of Pt c/o lesion on the penis, "a lump" with redness x 2 weeks. Post intercourse is when it was first noticed after feeling a pain pt states he look and saw that it was "big and bleeding"  it has stopped bleeding but is still swollen and irritated at the site.  Also occas abd discomfort; burping freq. Denies fever. Normal PO intake.  OBJECTIVE:  Vitals:   06/14/23 1501  BP: 111/75  Pulse: 81  Resp: 12  Temp: 98.8 F (37.1 C)  TempSrc: Oral  SpO2: 98%   General appearance: alert; no distress HENT: oropharynx: normal Lungs: unlabored respirations Abdomen: soft, non-tender; bowel sounds normal; no masses or organomegaly; no guarding or rebound tenderness Back: no CVA tenderness Extremities: no edema; symmetrical with no gross deformities GU: normal appearing Skin: warm and dry Neurologic: normal gait Psychological: alert and cooperative; normal mood and affect  Labs Reviewed - No data to display  Allergies  Allergen Reactions   Hydrocodone    Other     Allergic to nuts per patient    History reviewed. No  pertinent past medical history. Social History   Socioeconomic History   Marital status: Single    Spouse name: Not on file   Number of children: Not on file   Years of education: Not on file   Highest education level: Associate degree: academic program  Occupational History   Not on file  Tobacco Use   Smoking status: Never   Smokeless tobacco: Never  Vaping Use   Vaping status: Never Used  Substance and Sexual Activity   Alcohol use: Yes    Comment: rarely   Drug use: No   Sexual activity: Not on file  Other Topics Concern   Not on file  Social History Narrative   Not on file   Social Determinants of Health   Financial Resource Strain: Low Risk  (04/24/2023)   Overall Financial Resource Strain (CARDIA)    Difficulty of Paying Living Expenses: Not hard at all  Food Insecurity: No Food Insecurity (04/24/2023)   Hunger Vital Sign    Worried About Running Out of Food in the Last Year: Never true    Ran Out of Food in the Last Year: Never true  Transportation Needs: No Transportation Needs (04/24/2023)   PRAPARE - Administrator, Civil Service (Medical): No    Lack of Transportation (Non-Medical): No  Physical Activity: Sufficiently Active (04/24/2023)   Exercise Vital Sign    Days of Exercise per Week: 4 days  Minutes of Exercise per Session: 60 min  Stress: No Stress Concern Present (04/24/2023)   [employer] of Occupational Health - Occupational Stress Questionnaire    Feeling of Stress : Not at all  Social Connections: Unknown (04/24/2023)   Social Connection and Isolation Panel [NHANES]    Frequency of Communication with Friends and Family: More than three times a week    Frequency of Social Gatherings with Friends and Family: Once a week    Attends Religious Services: Never    Database administrator or Organizations: No    Attends Engineer, structural: Not on file    Marital Status: Patient declined  Intimate Partner Violence: Not on file    Family History  Problem Relation Age of Onset   Asthma Father    Allergies Father         Mardella Layman, MD 06/22/23 [id number]

## 2023-07-26 ENCOUNTER — Ambulatory Visit (INDEPENDENT_AMBULATORY_CARE_PROVIDER_SITE_OTHER): Payer: BC Managed Care – PPO | Admitting: Family Medicine

## 2023-07-26 VITALS — BP 109/76 | HR 90 | Temp 100.0°F | Ht 64.0 in | Wt 124.0 lb

## 2023-07-26 DIAGNOSIS — R6889 Other general symptoms and signs: Secondary | ICD-10-CM

## 2023-07-26 DIAGNOSIS — J029 Acute pharyngitis, unspecified: Secondary | ICD-10-CM | POA: Diagnosis not present

## 2023-07-26 LAB — POCT RAPID STREP A (OFFICE): Rapid Strep A Screen: NEGATIVE

## 2023-07-26 MED ORDER — AMOXICILLIN 500 MG PO CAPS: 500.0000 mg | ORAL_CAPSULE | Freq: Two times a day (BID) | ORAL | 0 refills | Status: AC

## 2023-07-26 NOTE — Patient Instructions (Signed)
Rest, fluids.  Medication as prescribed while awaiting culture.  Take care  Dr. Adriana Simas

## 2023-07-26 NOTE — Progress Notes (Signed)
Subjective:  Patient ID: Dale Lopez, male    DOB: 04-Jul-1996  Age: 27 y.o. MRN: [id number]  CC: Chief Complaint  Patient presents with   Cough    Throat congestion   Sore Throat   Headache    HPI:  27 year old male presents for evaluation of the above.  Symptoms started yesterday.  He reports cough, sore throat, generalized weakness, subjective fever.  He states that his father has been sick as well.  No home COVID testing.  No relieving factors.  No other complaints.    Patient Active Problem List   Diagnosis Date Noted   Pharyngitis 07/26/2023   F[place]yce spots 08/30/2022   Upper airway cough syndrome 03/29/2021    Social Hx   Social History   Socioeconomic History   Marital status: Single    Spouse name: Not on file   Number of children: Not on file   Years of education: Not on file   Highest education level: Associate degree: academic program  Occupational History   Not on file  Tobacco Use   Smoking status: Never   Smokeless tobacco: Never  Vaping Use   Vaping status: Never Used  Substance and Sexual Activity   Alcohol use: Yes    Comment: rarely   Drug use: No   Sexual activity: Not on file  Other Topics Concern   Not on file  Social History Narrative   Not on file   Social Determinants of Health   Financial Resource Strain: Low Risk  (04/24/2023)   Overall Financial Resource Strain (CARDIA)    Difficulty of Paying Living Expenses: Not hard at all  Food Insecurity: No Food Insecurity (04/24/2023)   Hunger Vital Sign    Worried About Running Out of Food in the Last Year: Never true    Ran Out of Food in the Last Year: Never true  Transportation Needs: No Transportation Needs (04/24/2023)   PRAPARE - Administrator, Civil Service (Medical): No    Lack of Transportation (Non-Medical): No  Physical Activity: Sufficiently Active (04/24/2023)   Exercise Vital Sign    Days of Exercise per Week: 4 days    Minutes of Exercise per Session: 60 min   Stress: No Stress Concern Present (04/24/2023)   [employer] of Occupational Health - Occupational Stress Questionnaire    Feeling of Stress : Not at all  Social Connections: Unknown (04/24/2023)   Social Connection and Isolation Panel [NHANES]    Frequency of Communication with Friends and Family: More than three times a week    Frequency of Social Gatherings with Friends and Family: Once a week    Attends Religious Services: Never    Database administrator or Organizations: No    Attends Engineer, structural: Not on file    Marital Status: Patient declined    Review of Systems Per HPI  Objective:  BP 109/76   Pulse 90   Temp 100 F (37.8 C) (Oral)   Ht 5\' 4"  (1.626 m)   Wt 124 lb (56.2 kg)   SpO2 96%   BMI 21.28 kg/m      07/26/2023    3:54 PM 06/14/2023    3:01 PM 04/24/2023    4:13 PM  BP/Weight  Systolic BP 109 111 118  Diastolic BP 76 75 76  Wt. (Lbs) 124  123  BMI 21.28 kg/m2  21.11 kg/m2    Physical Exam Vitals reviewed.  Constitutional:      General:  He is not in acute distress. HENT:     Head: Normocephalic and atraumatic.     Right Ear: Tympanic membrane normal.     Left Ear: Tympanic membrane normal.     Mouth/Throat:     Pharynx: Posterior oropharyngeal erythema present.  Cardiovascular:     Rate and Rhythm: Normal rate and regular rhythm.  Pulmonary:     Effort: Pulmonary effort is normal.     Breath sounds: Normal breath sounds. No wheezing or rales.  Neurological:     Mental Status: He is alert.     Lab Results  Component Value Date   WBC 8.9 03/08/2021   HGB 16.3 03/08/2021   HCT 48.0 03/08/2021   PLT 282 03/08/2021   GLUCOSE 71 04/30/2014   ALT 59 (H) 04/30/2014   AST 30 04/30/2014   NA 140 04/30/2014   K 4.7 04/30/2014   CL 105 04/30/2014   CREATININE 0.99 04/30/2014   BUN 10 04/30/2014   CO2 27 04/30/2014     Assessment & Plan:   Problem List Items Addressed This Visit       Respiratory   Pharyngitis -  Primary    Rapid strep negative.  Culture.  Placing on amoxicillin while awaiting culture.  Also awaiting COVID and flu testing.  Work note given.      Relevant Orders   Culture, Group A Strep   Other Visit Diagnoses     Flu-like symptoms       Relevant Orders   POCT rapid strep A (Completed)   Culture, Group A Strep   COVID-19, Flu A+B and RSV       Meds ordered this encounter  Medications   amoxicillin (AMOXIL) 500 MG capsule    Sig: Take 1 capsule (500 mg total) by mouth 2 (two) times daily for 10 days.    Dispense:  20 capsule    Refill:  0    Follow-up:  Return if symptoms worsen or fail to improve.  Dale Other DO [facility]

## 2023-07-26 NOTE — Assessment & Plan Note (Signed)
Rapid strep negative.  Culture.  Placing on amoxicillin while awaiting culture.  Also awaiting COVID and flu testing.  Work note given.

## 2023-07-28 LAB — COVID-19, FLU A+B AND RSV
Influenza A, NAA: NOT DETECTED
Influenza B, NAA: NOT DETECTED
RSV, NAA: NOT DETECTED
SARS-CoV-2, NAA: NOT DETECTED

## 2023-07-29 LAB — CULTURE, GROUP A STREP: Strep A Culture: NEGATIVE

## 2023-08-03 NOTE — Progress Notes (Signed)
H&P  Chief Complaint: Penile abnormality  History of Present Illness: 27 year old male self-referred for evaluation and management of penile abnormality.  He is sexually active, monogamous.  2 months ago after intercourse (used a condom) he noticed some bleeding from a raised area.  This lesion has persisted but gotten smaller.  No further bleeding.  He has had no prior STDs.  No past medical history on file.  Past Surgical History:  Procedure Laterality Date   WISDOM TOOTH EXTRACTION      Home Medications:  Allergies as of 08/08/2023       Reactions   Hydrocodone    Other    Allergic to nuts per patient        Medication List        Accurate as of August 03, 2023 12:01 PM. If you have any questions, ask your nurse or doctor.          famotidine 40 MG tablet Commonly known as: PEPCID Take 1 tablet (40 mg total) by mouth daily.        Allergies:  Allergies  Allergen Reactions   Hydrocodone    Other     Allergic to nuts per patient    Family History  Problem Relation Age of Onset   Asthma Father    Allergies Father     Social History:  reports that he has never smoked. He has never used smokeless tobacco. He reports current alcohol use. He reports that he does not use drugs.  ROS: A complete review of systems was performed.  All systems are negative except for pertinent findings as noted.  Physical Exam:  Vital signs in last 24 hours: There were no vitals taken for this visit. Constitutional:  Alert and oriented, No acute distress Cardiovascular: Regular rate  Respiratory: Normal respiratory effor Genitourinary: Circumcised phallus.  Slightly raised area in his frenulum.  Possible condylomata.  No other penile lesions noted.  Urethral meatus normal.  Scrotal skin normal.  Testicles normal. 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

## 2023-08-08 ENCOUNTER — Ambulatory Visit (INDEPENDENT_AMBULATORY_CARE_PROVIDER_SITE_OTHER): Payer: BC Managed Care – PPO | Admitting: Urology

## 2023-08-08 ENCOUNTER — Encounter: Payer: Self-pay | Admitting: Urology

## 2023-08-08 VITALS — BP 119/57 | HR 60

## 2023-08-08 DIAGNOSIS — N5089 Other specified disorders of the male genital organs: Secondary | ICD-10-CM

## 2023-08-08 MED ORDER — IMIQUIMOD 5 % EX CREA
TOPICAL_CREAM | CUTANEOUS | 1 refills | Status: DC
Start: 2023-08-08 — End: 2024-01-17

## 2023-08-09 ENCOUNTER — Telehealth: Payer: Self-pay

## 2023-08-09 NOTE — Telephone Encounter (Signed)
[employer] PA started [id number] For Imiquimod cream

## 2023-08-29 DIAGNOSIS — R053 Chronic cough: Secondary | ICD-10-CM | POA: Diagnosis not present

## 2024-01-15 ENCOUNTER — Ambulatory Visit: Payer: Self-pay

## 2024-01-15 NOTE — Telephone Encounter (Signed)
  Chief Complaint: worsening back pain Symptoms: pain, discomfort Frequency: x 6 mos - 1 yr Pertinent Negatives: Patient denies fever, injuries, numbness, incontinence Disposition: [] ED /[] Urgent Care (no appt availability in office) / [x] Appointment(In office/virtual)/ []  [place]  [facility]/ [] Home Care/ [] Refused Recommended Disposition /[] [place] [facility]/ []  Follow-up with PCP Additional Notes: Pt c/o upper and lowe back pain x 6 mos-19yr, that has progressively worsened. Pt endorses heavy lifting at work, and increased pain with movement/usage. Denies any injuries, numbness, incontinence. Of note, mentions neck/shoulder pain as well. Additionally, pt would like STI testing. Scheduled patient per protocol on 01/17/2024. Patient verbalized understanding and to call back with worsening symptoms.      Copied from CRM [id number]. Topic: Clinical - Red Word Triage >> Jan 15, 2024  8:59 AM Ivette P wrote: Red Word that prompted transfer to Nurse Triage: back pain - friction in spine. Severe pain, worsened. Reason for Disposition  Back pain present > 2 weeks  Patient is worried they have a sexually transmitted infection (STI)  Answer Assessment - Initial Assessment Questions 1. ONSET: "When did the pain begin?"      6 mos - 1 year, progressively getting worse 2. LOCATION: "Where does it hurt?" (upper, mid or lower back)     Upper and lower 3. SEVERITY: "How bad is the pain?"  (e.g., Scale 1-10; mild, moderate, or severe)   - MILD (1-3): Doesn't interfere with normal activities.    - MODERATE (4-7): Interferes with normal activities or awakens from sleep.    - SEVERE (8-10): Excruciating pain, unable to do any normal activities.      1-2/10 at rest 4-5/10 with movement 4. PATTERN: "Is the pain constant?" (e.g., yes, no; constant, intermittent)      Intermittent, constant with movement 5. RADIATION: "Does the pain shoot into your legs or somewhere else?"     no 6. CAUSE:  "What do  you think is causing the back pain?"      Does a lot of lifting at work, perhaps mattress 7. BACK OVERUSE:  "Any recent lifting of heavy objects, strenuous work or exercise?"     Strenuous work 8. MEDICINES: "What have you taken so far for the pain?" (e.g., nothing, acetaminophen, NSAIDS)     No, "just uncomfortable really" 9. NEUROLOGIC SYMPTOMS: "Do you have any weakness, numbness, or problems with bowel/bladder control?"     no 10. OTHER SYMPTOMS: "Do you have any other symptoms?" (e.g., fever, abdomen pain, burning with urination, blood in urine)       Some neck and shoulder pain  Answer Assessment - Initial Assessment Questions 1. MAIN CONCERN: "What were you exposed to?"  "What sexually transmitted infection (STI) does your sex partner have?" (e.g., gonorrhea, herpes, HIV, pubic lice)     Yeast infection 2. ROUTE of EXPOSURE: "How were you exposed to the STI?" (e.g., oral, vaginal, or rectal intercourse)     vaginal 3. DATE of EXPOSURE: "When did the exposure occur?" (e.g., days)     Saturday 4. SYMPTOMS: "Do you have any symptoms?" (e.g., pain with urination, rash, sores)     no  Protocols used: Back Pain-A-AH, STI Exposure-A-AH

## 2024-01-17 ENCOUNTER — Encounter: Payer: Self-pay | Admitting: Family Medicine

## 2024-01-17 ENCOUNTER — Ambulatory Visit (INDEPENDENT_AMBULATORY_CARE_PROVIDER_SITE_OTHER): Admitting: Family Medicine

## 2024-01-17 VITALS — BP 141/83 | HR 63 | Temp 98.0°F | Ht 64.0 in | Wt 126.0 lb

## 2024-01-17 DIAGNOSIS — Z113 Encounter for screening for infections with a predominantly sexual mode of transmission: Secondary | ICD-10-CM

## 2024-01-17 DIAGNOSIS — Z114 Encounter for screening for human immunodeficiency virus [HIV]: Secondary | ICD-10-CM | POA: Diagnosis not present

## 2024-01-17 DIAGNOSIS — M7918 Myalgia, other site: Secondary | ICD-10-CM

## 2024-01-17 NOTE — Progress Notes (Signed)
   Subjective:    Patient ID: Dale Lopez, male    DOB: 1996/08/21, 28 y.o.   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  Denies any injury.  This been going on for several months.  No numbness or tingling.  Shoulder pain pain that radiates from the trapezius into his shoulder.  Not down the arm.  Has good range of motion of the shoulder.  Does a lot of heavy lifting repetitively  STD exposure-apparently his long-term girlfriend has had some intermittent bacterial vaginitis and the gynecologist recommended STD screening    Review of Systems     Objective:   Physical Exam  General-in no acute distress Eyes-no discharge Lungs-respiratory rate normal, CTA CV-no murmurs,RRR Extremities skin warm dry no edema Neuro grossly normal Behavior normal, alert GU exam normal Normal shoulder range of motion.  Subjective tenderness right trapezius     Assessment & Plan:  1. Musculoskeletal pain (Primary) I suspect that this is due to repetitive motion May use ibuprofen sparingly.  Also recommend stretches massage and gentle range of motion. If he does not improve I would recommend physical therapy and ultimately if it gets worse or starts radiating down the arm recommend imaging and possible consult with specialist 2. Screen for STD (sexually transmitted disease) Screening as discussed with patient he is open to this - RPR - Chlamydia/Gonococcus/Trichomonas, NAA - HIV antibody (with reflex)  3. Screening for HIV (human immunodeficiency virus) Screening - HIV antibody (with reflex)  I do not find any evidence of STD

## 2024-01-18 ENCOUNTER — Encounter: Payer: Self-pay | Admitting: Family Medicine

## 2024-01-18 ENCOUNTER — Telehealth: Payer: Self-pay

## 2024-01-18 DIAGNOSIS — Z114 Encounter for screening for human immunodeficiency virus [HIV]: Secondary | ICD-10-CM | POA: Diagnosis not present

## 2024-01-18 DIAGNOSIS — Z113 Encounter for screening for infections with a predominantly sexual mode of transmission: Secondary | ICD-10-CM | POA: Diagnosis not present

## 2024-01-18 NOTE — Telephone Encounter (Signed)
 Ok to be out the rest of week per Dr Lorin Picket- Note up from for pick up

## 2024-01-18 NOTE — Telephone Encounter (Signed)
 Patient come yesterday requesting a work letter to take him out of work from March 26th to March 31 st for back pain   Only thing I seen from Dr Lorin Picket was to just take him out yesterday?

## 2024-01-19 ENCOUNTER — Encounter: Payer: Self-pay | Admitting: Family Medicine

## 2024-01-19 LAB — RPR: RPR Ser Ql: NONREACTIVE

## 2024-01-19 LAB — HIV ANTIBODY (ROUTINE TESTING W REFLEX): HIV Screen 4th Generation wRfx: NONREACTIVE

## 2024-01-21 LAB — CHLAMYDIA/GONOCOCCUS/TRICHOMONAS, NAA
Chlamydia by NAA: NEGATIVE
Gonococcus by NAA: NEGATIVE
Trich vag by NAA: NEGATIVE

## 2024-01-29 DIAGNOSIS — J1089 Influenza due to other identified influenza virus with other manifestations: Secondary | ICD-10-CM | POA: Diagnosis not present
